# Patient Record
Sex: Female | Born: 1980 | Race: White | Hispanic: No | Smoking: Current every day smoker
Health system: Southern US, Community
[De-identification: ages and names within clinical notes are randomized; demographics above are authoritative.]

## PROBLEM LIST (undated history)

## (undated) DIAGNOSIS — I10 Essential (primary) hypertension: Secondary | ICD-10-CM

## (undated) DIAGNOSIS — R87619 Unspecified abnormal cytological findings in specimens from cervix uteri: Secondary | ICD-10-CM

## (undated) HISTORY — PX: COLPOSCOPY VULVA W/ BIOPSY: SUR282

## (undated) HISTORY — DX: Essential (primary) hypertension: I10

## (undated) HISTORY — DX: Unspecified abnormal cytological findings in specimens from cervix uteri: R87.619

## (undated) HISTORY — PX: DILATION AND CURETTAGE OF UTERUS: SHX78

---
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
## 2002-07-25 ENCOUNTER — Inpatient Hospital Stay (HOSPITAL_COMMUNITY): Admission: AD | Admit: 2002-07-25 | Discharge: 2002-07-27 | Payer: Self-pay | Admitting: Obstetrics and Gynecology

## 2006-01-30 ENCOUNTER — Emergency Department (HOSPITAL_COMMUNITY): Admission: EM | Admit: 2006-01-30 | Discharge: 2006-01-30 | Payer: Self-pay | Admitting: Emergency Medicine

## 2008-11-09 ENCOUNTER — Other Ambulatory Visit: Admission: RE | Admit: 2008-11-09 | Discharge: 2008-11-09 | Payer: Self-pay | Admitting: Obstetrics and Gynecology

## 2009-06-11 ENCOUNTER — Emergency Department (HOSPITAL_COMMUNITY): Admission: EM | Admit: 2009-06-11 | Discharge: 2009-06-11 | Payer: Self-pay | Admitting: Emergency Medicine

## 2009-07-03 ENCOUNTER — Emergency Department (HOSPITAL_COMMUNITY): Admission: EM | Admit: 2009-07-03 | Discharge: 2009-07-03 | Payer: Self-pay | Admitting: Emergency Medicine

## 2009-11-27 ENCOUNTER — Emergency Department (HOSPITAL_COMMUNITY): Admission: EM | Admit: 2009-11-27 | Discharge: 2009-11-27 | Payer: Self-pay | Admitting: Emergency Medicine

## 2014-04-25 ENCOUNTER — Other Ambulatory Visit (HOSPITAL_COMMUNITY)
Admission: RE | Admit: 2014-04-25 | Discharge: 2014-04-25 | Disposition: A | Discharge status: Home / Self Care | Payer: Self-pay | Source: Ambulatory Visit | Attending: Unknown Physician Specialty | Admitting: Unknown Physician Specialty

## 2014-04-25 DIAGNOSIS — D060 Carcinoma in situ of endocervix: Secondary | ICD-10-CM | POA: Insufficient documentation

## 2014-04-25 DIAGNOSIS — Z01419 Encounter for gynecological examination (general) (routine) without abnormal findings: Secondary | ICD-10-CM | POA: Insufficient documentation

## 2014-04-28 LAB — CYTOLOGY - PAP

## 2014-07-24 ENCOUNTER — Encounter: Payer: Self-pay | Admitting: *Deleted

## 2014-07-24 ENCOUNTER — Ambulatory Visit (INDEPENDENT_AMBULATORY_CARE_PROVIDER_SITE_OTHER): Payer: 59 | Admitting: Obstetrics and Gynecology

## 2014-07-24 ENCOUNTER — Encounter: Payer: Self-pay | Admitting: Obstetrics and Gynecology

## 2014-07-24 VITALS — BP 144/88 | Ht 64.0 in

## 2014-07-24 DIAGNOSIS — R87612 Low grade squamous intraepithelial lesion on cytologic smear of cervix (LGSIL): Secondary | ICD-10-CM

## 2014-07-24 DIAGNOSIS — N891 Moderate vaginal dysplasia: Secondary | ICD-10-CM

## 2014-07-24 DIAGNOSIS — R87619 Unspecified abnormal cytological findings in specimens from cervix uteri: Secondary | ICD-10-CM | POA: Insufficient documentation

## 2014-07-24 DIAGNOSIS — Z9889 Other specified postprocedural states: Secondary | ICD-10-CM

## 2014-07-24 NOTE — Progress Notes (Signed)
Patient ID: Kathy Bradley, female   DOB: [birth year], 34 y.o.   MRN: [id number] Pt here today to discuss LEEP procedure. Pt doe snot want to have it done today just wants to discuss it.

## 2014-07-24 NOTE — Progress Notes (Signed)
Patient ID: Kathy Bradley, female   DOB: 17-Oct-1980, 34 y.o.   MRN: [id number] Patient ID: Kathy Bradley, female   DOB: 06/04/1981, 34 y.o.   MRN: [id number]   [facility] Visit  Patient name: Kathy Bradley MRN [id number]  Date of birth: 12-02-1980  CC & HPI:  Kathy Bradley is a 34 y.o. female presenting today to discuss the possibility of a LEEP procedure due to an abnormal pap smear.  She has an IUD in place currently and does not have a menses.  She has had intermittent right sided pelvic pain that she describes as "cramping" that started February 2015.   Pathology at [facility]"CIN II/III/CIS and pap concurs Pt has many questions and is initially skeptical of need for Colpo and probable LEEP, and has questions re" proceeding directly to cesarean. She accepts explanation of need for accuracy of DX.  ROS:  All systems have been reviewed and are negative unless otherwise indicated in the HPI.  Pertinent History Reviewed:   Reviewed: Significant for  Medical         Past Medical History  Diagnosis Date  . Abnormal Pap smear of cervix                               Surgical Hx:    Past Surgical History  Procedure Laterality Date  . Dilation and curettage of uterus    . Colposcopy vulva w/ biopsy     Medications: Reviewed & Updated - see associated section                      Current outpatient prescriptions: levonorgestrel (MIRENA) 20 MCG/24HR IUD, 1 each by Intrauterine route once., Disp: , Rfl:    Social History: Reviewed -  reports that she quit smoking about 8 months ago. Her smoking use included Cigarettes. She has a 10 pack-year smoking history. She has never used smokeless tobacco.  Objective Findings:  Vitals: Blood pressure 144/88, height  (1.626 m).  Physical Examination: Discussion only.  Lengthy discussion of pap smear results and treatment options.   Assessment & Plan:   A:  1. 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 Follow-up  in 2 wks for colpo and probable LEEP  This chart was scribed for Tilda Burrow, MD by Carl Best, ED Scribe. The patient's care was started at 3:15 PM.

## 2014-07-24 NOTE — Progress Notes (Deleted)
Patient ID: Kathy Bradley, female   DOB: [birth year], 34 y.o.   MRN: [id number]   [facility] Ob[facility] Clinic Visit  Patient name: Kathy Bradley MRN [id number]  Date of birth: 09-Aug-1980  CC & HPI:  Kathy Bradley is a 34 y.o. female presenting today to discuss the possibility of a LEEP procedure due to an abnormal pap smear.  She has an IUD in place currently and does not have a menses.  She has had intermittent right sided pelvic pain that she describes as "cramping" that started February 2015.   Pathology at [facility]"CIN II/III/CIS and pap concurs Pt has many questions and is initially skeptical of need for Colpo and probable LEEP, and has questions re" proceeding directly to cesarean. She accepts explanation of need for accuracy of DX.  ROS:  All systems have been reviewed and are negative unless otherwise indicated in the HPI.  Pertinent History Reviewed:   Reviewed: Significant for  Medical         Past Medical History  Diagnosis Date   Abnormal Pap smear of cervix                               Surgical Hx:    Past Surgical History  Procedure Laterality Date   Dilation and curettage of uterus     Colposcopy vulva w/ biopsy     Medications: Reviewed & Updated - see associated section                      Current outpatient prescriptions: levonorgestrel (MIRENA) 20 MCG/24HR IUD, 1 each by Intrauterine route once., Disp: , Rfl:    Social History: Reviewed -  reports that she quit smoking about 8 months ago. Her smoking use included Cigarettes. She has a 10 pack-year smoking history. She has never used smokeless tobacco.  Objective Findings:  Vitals: Blood pressure 144/88, height  (1.626 m).  Physical Examination: Discussion only.  Lengthy discussion of pap smear results and treatment options.   Assessment & Plan:   A:  1. 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 Follow-up in 2 wks for colpo and probable LEEP  This chart was scribed for Tilda Burrow, MD by Carl Best, ED Scribe. The patient's care was started at 3:15 PM.

## 2014-08-07 ENCOUNTER — Encounter: Payer: 59 | Admitting: Obstetrics and Gynecology

## 2014-08-08 ENCOUNTER — Encounter: Payer: Self-pay | Admitting: Obstetrics and Gynecology

## 2014-08-08 ENCOUNTER — Ambulatory Visit (INDEPENDENT_AMBULATORY_CARE_PROVIDER_SITE_OTHER): Payer: 59 | Admitting: Obstetrics and Gynecology

## 2014-08-08 VITALS — BP 156/104 | Ht 64.0 in

## 2014-08-08 DIAGNOSIS — R87613 High grade squamous intraepithelial lesion on cytologic smear of cervix (HGSIL): Secondary | ICD-10-CM

## 2014-08-08 DIAGNOSIS — Z3202 Encounter for pregnancy test, result negative: Secondary | ICD-10-CM

## 2014-08-08 DIAGNOSIS — Z3200 Encounter for pregnancy test, result unknown: Secondary | ICD-10-CM

## 2014-08-08 LAB — POCT URINE PREGNANCY: Preg Test, Ur: NEGATIVE

## 2014-08-08 NOTE — Progress Notes (Signed)
Patient ID: Kathy FreezeJasmine D Bradley, female   DOB: 21-Jun-1981, 34 y.o.   MRN: [id number] Pt here today for Colposcopy and probable LEEP. Pt is very anxious and nervious about the procedures. Procedures were explained and consent was signed for both.   Kathy Bradley 34 y.o. [id number] here for colposcopy for high-grade squamous intraepithelial neoplasia  (HGSIL-encompassing moderate and severe dysplasia) pap smear form health dept.  Discussed role for HPV in cervical dysplasia, need for surveillance.  Patient given informed consent, signed copy in the chart, time out was performed.  Placed in lithotomy position. Cervix viewed with speculum and colposcope after application of acetic acid.   Colposcopy adequate? Yes  acetowhite lesion(s) noted at over anterior lip of everted cervix that has an old obstetric laceration at 9 o'clock ( had a difficult delivery, hosp'd x days, didn't get transfusion)and mosaicism noted at 10-1 olclock o'clock; biopsies obtained at none taken , has prior bx.   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 Will follow up pathology and manage accordingly.  Routine preventative health maintenance measures emphasized.   Will Schedule CKC at hospital with laser

## 2014-08-08 NOTE — Progress Notes (Signed)
Scheduled for 08-22-14 per Dr Emelda FearFerguson request

## 2014-08-10 ENCOUNTER — Telehealth: Payer: Self-pay | Admitting: Obstetrics and Gynecology

## 2014-08-10 NOTE — Telephone Encounter (Signed)
I advised the pt that per Ferg she would be out the rest of the week following her surgery. Pt verbalized understanding.

## 2014-08-15 DIAGNOSIS — Z029 Encounter for administrative examinations, unspecified: Secondary | ICD-10-CM

## 2014-08-15 NOTE — Patient Instructions (Signed)
Kathy Bradley  08/15/2014   Your procedure is scheduled on:  08/22/2014  Report to [facility] at  615  AM.  Call this number if you have problems the morning of surgery: [phone]   Remember:   Do not eat food or drink liquids after midnight.   Take these medicines the morning of surgery with A SIP OF WATER: none   Do not wear jewelry, make-up or nail polish.  Do not wear lotions, powders, or perfumes.   Do not shave 48 hours prior to surgery. Men may shave face and neck.  Do not bring valuables to the hospital.  [facility] is not responsible for any belongings or valuables.               Contacts, dentures or bridgework may not be worn into surgery.  Leave suitcase in the car. After surgery it may be brought to your room.  For patients admitted to the hospital, discharge time is determined by your treatment team.               Patients discharged the day of surgery will not be allowed to drive home.  Name and phone number of your driver: family  Special Instructions: Shower using CHG 2 nights before surgery and the night before surgery.  If you shower the day of surgery use CHG.  Use special wash - you have one bottle of CHG for all showers.  You should use approximately 1/3 of the bottle for each shower.   Please read over the following fact sheets that you were given: Pain Booklet, Coughing and Deep Breathing, Surgical Site Infection Prevention, Anesthesia Post-op Instructions and Care and Recovery After Surgery Conization of the Cervix Cervical conization is the cutting (excision) of a cone-shaped portion of the cervix. The procedure is performed through the vagina in either your health care provider's office or an operating room. This procedure is usually done when there is abnormal bleeding from the cervix. It can also be done to evaluate an abnormal Pap test or if an abnormality is seen on the cervix during an exam. The tissue is then examined to see if there are  precancerous cells or cancer present.  Conization of the cervix is not done during a menstrual period or pregnancy.  LET [facility] CARE PROVIDER KNOW ABOUT:  Any allergies you have.   All medicines you are taking, including vitamins, herbs, eye drops, creams, and over-the-counter medicines.   Previous problems you or members of your family have had with the use of anesthetics.   Any blood disorders you have.   Previous surgeries you have had.   Medical conditions you have.   Your smoking habits.   The possibility of being pregnant.  RISKS AND COMPLICATIONS  Generally, conization of the cervix is a safe procedure. However, as with any procedure, complications can occur. Possible complications include:  Heavy bleeding several days or weeks after the procedure. Light bleeding or spotting after the procedure is normal.  Infection (rare).  Damage to the cervix or surrounding organs (uncommon).   Problems with the anesthesia.   Increased risk of preterm labor in future pregnancies. BEFORE THE PROCEDURE  Do not eat or drink anything for 6-8 hours before the procedure.   Do not take aspirin or blood thinners for at least a week before the procedure or as directed by your health care provider.   Arrange for someone to take you home after the procedure.  PROCEDURE There are three different methods to perform conization of the cervix. These include:   The cold knife method. In this method a small cone-shaped sample of tissue is cut out with a knife (scalpel) from the cervical canal and the transformation zone (where the normal cells end and the abnormal cells begin).   The LEEP method. In this method a small cone-shaped sample of tissue is cut out with a thin wire that can burn (cauterize) the cervical tissue with an electrical current.   Laser treatment. In this method a small cone-shaped sample of tissue is cut out and then cauterized with a laser beam to prevent  bleeding.  The procedure will be performed as follows:   Depending on the method, you will either be given a medicine to make you sleep (general anesthetic) or a numbing medicine (local anesthetic). A medicine that numbs the cervix (cervical block) may be given.   A lubricated device called a speculum will be inserted into the vagina to spread open the walls of the vagina. This will help your health care provider see the inside of the vagina and cervix better.   The tissue from the cervix will be removed and examined.   The results of the procedure will help your health care provider decide if further treatment is necessary. They will also help your health care provider decide on the best treatment if your results are abnormal. AFTER THE PROCEDURE  If you had a general anesthetic, you may be groggy for 2-3 hours after the procedure.   If you had a local anesthetic, you will rest at the clinic or hospital until you are stable and feel ready to go home.   Recovery may take up to 3 weeks.   You may have some cramping for about 1 week.   You may have bloody discharge or light bleeding for 1-2 weeks.   You may have black discharge coming from the vagina. This is from the paste used on the cervix to prevent bleeding. This is normal discharge.  Document Released: 04/16/2005 Document Revised: 07/12/2013 Document Reviewed: 12/31/2012 [facility] Patient Information 2015 [employer] [place], [place]. This information is not intended to replace advice given to you by your health care provider. Make sure you discuss any questions you have with your health care provider. PATIENT INSTRUCTIONS POST-ANESTHESIA  IMMEDIATELY FOLLOWING SURGERY:  Do not drive or operate machinery for the first twenty four hours after surgery.  Do not make any important decisions for twenty four hours after surgery or while taking narcotic pain medications or sedatives.  If you develop intractable nausea and vomiting or a severe  headache please notify your doctor immediately.  FOLLOW-UP:  Please make an appointment with your surgeon as instructed. You do not need to follow up with anesthesia unless specifically instructed to do so.  WOUND CARE INSTRUCTIONS (if applicable):  Keep a dry clean dressing on the anesthesia/puncture wound site if there is drainage.  Once the wound has quit draining you may leave it open to air.  Generally you should leave the bandage intact for twenty four hours unless there is drainage.  If the epidural site drains for more than 36-48 hours please call the anesthesia department.  QUESTIONS?:  Please feel free to call your physician or the hospital operator if you have any questions, and they will be happy to assist you.

## 2014-08-16 ENCOUNTER — Other Ambulatory Visit: Payer: Self-pay | Admitting: Obstetrics and Gynecology

## 2014-08-16 ENCOUNTER — Encounter (HOSPITAL_COMMUNITY): Payer: Self-pay

## 2014-08-16 ENCOUNTER — Encounter (HOSPITAL_COMMUNITY)
Admission: RE | Admit: 2014-08-16 | Discharge: 2014-08-16 | Disposition: A | Discharge status: Home / Self Care | Payer: 59 | Source: Ambulatory Visit | Attending: Obstetrics and Gynecology | Admitting: Obstetrics and Gynecology

## 2014-08-16 DIAGNOSIS — Z01812 Encounter for preprocedural laboratory examination: Secondary | ICD-10-CM | POA: Diagnosis not present

## 2014-08-16 DIAGNOSIS — N871 Moderate cervical dysplasia: Secondary | ICD-10-CM | POA: Insufficient documentation

## 2014-08-16 DIAGNOSIS — Z87891 Personal history of nicotine dependence: Secondary | ICD-10-CM | POA: Diagnosis not present

## 2014-08-16 LAB — CBC
HCT: 38.7 % (ref 36.0–46.0)
Hemoglobin: 12.9 g/dL (ref 12.0–15.0)
MCH: 30.4 pg (ref 26.0–34.0)
MCHC: 33.3 g/dL (ref 30.0–36.0)
MCV: 91.3 fL (ref 78.0–100.0)
Platelets: 167 10*3/uL (ref 150–400)
RBC: 4.24 MIL/uL (ref 3.87–5.11)
RDW: 13.1 % (ref 11.5–15.5)
WBC: 6 10*3/uL (ref 4.0–10.5)

## 2014-08-16 LAB — URINALYSIS, ROUTINE W REFLEX MICROSCOPIC
Bilirubin Urine: NEGATIVE
Glucose, UA: NEGATIVE mg/dL
Ketones, ur: NEGATIVE mg/dL
Nitrite: NEGATIVE
Protein, ur: NEGATIVE mg/dL
Specific Gravity, Urine: >1.03 — ABNORMAL HIGH (ref 1.005–1.030)
Urobilinogen, UA: 0.2 mg/dL (ref 0.0–1.0)
pH: 6 (ref 5.0–8.0)

## 2014-08-16 LAB — URINE MICROSCOPIC-ADD ON

## 2014-08-16 LAB — HCG, SERUM, QUALITATIVE: Preg, Serum: NEGATIVE

## 2014-08-18 ENCOUNTER — Telehealth: Payer: Self-pay | Admitting: Obstetrics and Gynecology

## 2014-08-18 NOTE — Telephone Encounter (Signed)
Will make necessary changes on FMLA and refax to pt.

## 2014-08-21 ENCOUNTER — Other Ambulatory Visit: Payer: Self-pay | Admitting: Obstetrics and Gynecology

## 2014-08-21 NOTE — H&P (Signed)
Kathy Bradley is an 34 y.o. female. She is admitted for Cervical cold knife conization, with plans to use the laser due to the significant irregularity to the cervical contour from a prior obstetric laceration to the cervix. 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 The patient is unwilling to consider leep, under local anesthesia due to patient anxiety.  Pertinent Gynecological History: Menses: flow is moderate Bleeding:  Contraception: IUD DES exposure: unknown Blood transfusions: none Sexually transmitted diseases: no past history Previous GYN Procedures[facility]  Last mammogram:  Date:  Last pap: abnormal: 2015 Date: Cin II-II OB History: G2, P1011   Menstrual History: Menarche age:  No LMP recorded. Patient is not currently having periods (Reason: IUD).    Past Medical History  Diagnosis Date  . Abnormal Pap smear of cervix     Past Surgical History  Procedure Laterality Date  . Dilation and curettage of uterus    . Colposcopy vulva w/ biopsy      Family History  Problem Relation Age of Onset  . Hypertension Mother   . Hypertension Father   . Cancer Paternal Aunt     cervical  . Hypertension Maternal Grandmother   . Hypertension Maternal Grandfather   . Cancer Paternal Grandmother     ovarian    Social History:  reports that she quit smoking about 9 months ago. Her smoking use included Cigarettes. She has a 10 pack-year smoking history. She has never used smokeless tobacco. She reports that she drinks alcohol. She reports that she does not use illicit drugs.  Allergies:  Allergies  Allergen Reactions  . Penicillins      (Not in a hospital admission)  Review of Systems  Eyes: Negative.   Cardiovascular: Negative.   Genitourinary: Negative for dysuria, urgency and frequency.   Musculoskeletal: Negative for myalgias.  Neurological: Negative for dizziness.    There were no vitals taken for this visit. Physical Exam  Constitutional: She is oriented to person, place, and time. She appears well-developed and well-nourished.  HENT:  Head: Normocephalic and atraumatic.  Eyes: Pupils are equal, round, and reactive to light.  Cardiovascular: Normal rate.   Respiratory: Effort normal.  GI: Soft.  Genitourinary: Vagina normal and uterus normal.  Cervix lesion across entire anterior lip with irregular cervix with old ob laceration of cervical tissue for this porcedure.  Musculoskeletal: Normal range of motion. She exhibits no edema.  Neurological: She is alert and oriented to person, place, and time. She has normal reflexes.  Skin: Skin is warm and dry.  Psychiatric: She has a normal mood and affect. Her behavior is normal.    No results found for this or any previous visit (from the past 24 hour(s)).  No results found.  Assessment/Plan: CIN II-III, for laser removal this wee,.  Kathy Bradley V 08/21/2014, 8:54 PM

## 2014-08-22 ENCOUNTER — Encounter (HOSPITAL_COMMUNITY)
Admission: RE | Disposition: A | Discharge status: Home / Self Care | Payer: Self-pay | Source: Ambulatory Visit | Attending: Obstetrics and Gynecology

## 2014-08-22 ENCOUNTER — Ambulatory Visit (HOSPITAL_COMMUNITY): Payer: 59 | Admitting: Anesthesiology

## 2014-08-22 ENCOUNTER — Ambulatory Visit (HOSPITAL_COMMUNITY)
Admission: RE | Admit: 2014-08-22 | Discharge: 2014-08-22 | Disposition: A | Discharge status: Home / Self Care | Payer: 59 | Source: Ambulatory Visit | Attending: Obstetrics and Gynecology | Admitting: Obstetrics and Gynecology

## 2014-08-22 DIAGNOSIS — Z880 Allergy status to penicillin: Secondary | ICD-10-CM | POA: Diagnosis not present

## 2014-08-22 DIAGNOSIS — N871 Moderate cervical dysplasia: Secondary | ICD-10-CM

## 2014-08-22 DIAGNOSIS — N879 Dysplasia of cervix uteri, unspecified: Secondary | ICD-10-CM | POA: Diagnosis present

## 2014-08-22 DIAGNOSIS — Z87891 Personal history of nicotine dependence: Secondary | ICD-10-CM | POA: Insufficient documentation

## 2014-08-22 DIAGNOSIS — N870 Mild cervical dysplasia: Secondary | ICD-10-CM

## 2014-08-22 DIAGNOSIS — D069 Carcinoma in situ of cervix, unspecified: Secondary | ICD-10-CM

## 2014-08-22 HISTORY — PX: HOLMIUM LASER APPLICATION: SHX5852

## 2014-08-22 HISTORY — PX: CERVICAL CONIZATION W/BX: SHX1330

## 2014-08-22 SURGERY — CONE BIOPSY, CERVIX
Anesthesia: General

## 2014-08-22 MED ORDER — ONDANSETRON HCL 4 MG/2ML IJ SOLN
4.0000 mg | Freq: Once | INTRAMUSCULAR | Status: AC
  Administered 2014-08-22: 4 mg via INTRAVENOUS

## 2014-08-22 MED ORDER — LACTATED RINGERS IV SOLN
INTRAVENOUS | Status: DC
  Administered 2014-08-22: 1000 mL via INTRAVENOUS

## 2014-08-22 MED ORDER — FERRIC SUBSULFATE 259 MG/GM EX SOLN
CUTANEOUS | Status: DC | PRN
  Administered 2014-08-22: 1 via TOPICAL

## 2014-08-22 MED ORDER — FENTANYL CITRATE 0.05 MG/ML IJ SOLN
25.0000 ug | INTRAMUSCULAR | Status: AC
  Administered 2014-08-22 (×2): 25 ug via INTRAVENOUS

## 2014-08-22 MED ORDER — FERRIC SUBSULFATE 259 MG/GM EX SOLN
CUTANEOUS | Status: AC
  Filled 2014-08-22: qty 8

## 2014-08-22 MED ORDER — MIDAZOLAM HCL 5 MG/5ML IJ SOLN
INTRAMUSCULAR | Status: DC | PRN
  Administered 2014-08-22: 2 mg via INTRAVENOUS

## 2014-08-22 MED ORDER — FENTANYL CITRATE 0.05 MG/ML IJ SOLN
INTRAMUSCULAR | Status: DC | PRN
  Administered 2014-08-22 (×2): 25 ug via INTRAVENOUS
  Administered 2014-08-22: 50 ug via INTRAVENOUS

## 2014-08-22 MED ORDER — BUPIVACAINE-EPINEPHRINE 0.5% -1:200000 IJ SOLN
INTRAMUSCULAR | Status: DC | PRN
  Administered 2014-08-22: 20 mL

## 2014-08-22 MED ORDER — BUPIVACAINE-EPINEPHRINE (PF) 0.5% -1:200000 IJ SOLN
INTRAMUSCULAR | Status: AC
  Filled 2014-08-22: qty 30

## 2014-08-22 MED ORDER — GLYCOPYRROLATE 0.2 MG/ML IJ SOLN
INTRAMUSCULAR | Status: AC
  Filled 2014-08-22: qty 1

## 2014-08-22 MED ORDER — KETOROLAC TROMETHAMINE 10 MG PO TABS: 10.0000 mg | ORAL_TABLET | Freq: Four times a day (QID) | ORAL | Status: DC | PRN

## 2014-08-22 MED ORDER — MIDAZOLAM HCL 2 MG/2ML IJ SOLN
INTRAMUSCULAR | Status: AC
  Filled 2014-08-22: qty 2

## 2014-08-22 MED ORDER — LIDOCAINE HCL (PF) 1 % IJ SOLN
INTRAMUSCULAR | Status: AC
  Filled 2014-08-22: qty 5

## 2014-08-22 MED ORDER — METRONIDAZOLE 0.75 % VA GEL: 1.0000 | Freq: Every day | VAGINAL | Status: DC

## 2014-08-22 MED ORDER — MIDAZOLAM HCL 2 MG/2ML IJ SOLN
1.0000 mg | INTRAMUSCULAR | Status: DC | PRN
  Administered 2014-08-22: 2 mg via INTRAVENOUS

## 2014-08-22 MED ORDER — GLYCOPYRROLATE 0.2 MG/ML IJ SOLN
0.2000 mg | Freq: Once | INTRAMUSCULAR | Status: AC
  Administered 2014-08-22: 0.2 mg via INTRAVENOUS

## 2014-08-22 MED ORDER — OXYCODONE-ACETAMINOPHEN 5-325 MG PO TABS: 1.0000 | ORAL_TABLET | ORAL | Status: DC | PRN

## 2014-08-22 MED ORDER — PROPOFOL 10 MG/ML IV BOLUS
INTRAVENOUS | Status: DC | PRN
  Administered 2014-08-22: 150 mg via INTRAVENOUS

## 2014-08-22 MED ORDER — FENTANYL CITRATE 0.05 MG/ML IJ SOLN
INTRAMUSCULAR | Status: AC
  Filled 2014-08-22: qty 2

## 2014-08-22 MED ORDER — LIDOCAINE HCL 1 % IJ SOLN
INTRAMUSCULAR | Status: DC | PRN
  Administered 2014-08-22: 30 mg via INTRADERMAL

## 2014-08-22 MED ORDER — PROPOFOL 10 MG/ML IV BOLUS
INTRAVENOUS | Status: AC
  Filled 2014-08-22: qty 20

## 2014-08-22 MED ORDER — ONDANSETRON HCL 4 MG/2ML IJ SOLN
INTRAMUSCULAR | Status: AC
  Filled 2014-08-22: qty 2

## 2014-08-22 SURGICAL SUPPLY — 28 items
BAG HAMPER (MISCELLANEOUS) ×2 IMPLANT
BLADE SURG 15 STRL LF DISP TIS (BLADE) ×1 IMPLANT
BLADE SURG 15 STRL SS (BLADE)
CATH ROBINSON RED A/P 16FR (CATHETERS) IMPLANT
CLOTH BEACON ORANGE TIMEOUT ST (SAFETY) ×2 IMPLANT
COVER LIGHT HANDLE STERIS (MISCELLANEOUS) ×4 IMPLANT
DRAPE PROXIMA HALF (DRAPES) ×2 IMPLANT
ELECT REM PT RETURN 9FT ADLT (ELECTROSURGICAL) ×2
ELECTRODE REM PT RTRN 9FT ADLT (ELECTROSURGICAL) ×1 IMPLANT
FORMALIN 10 PREFIL 120ML (MISCELLANEOUS) ×2 IMPLANT
GLOVE BIO SURGEON STRL SZ 6.5 (GLOVE) ×1 IMPLANT
GLOVE BIOGEL PI IND STRL 7.0 (GLOVE) IMPLANT
GLOVE BIOGEL PI IND STRL 9 (GLOVE) ×1 IMPLANT
GLOVE BIOGEL PI INDICATOR 7.0 (GLOVE) ×3
GLOVE BIOGEL PI INDICATOR 9 (GLOVE) ×1
GLOVE ECLIPSE 6.5 STRL STRAW (GLOVE) ×1 IMPLANT
GLOVE ECLIPSE 9.0 STRL (GLOVE) ×2 IMPLANT
GOWN SPEC L3 XXLG W/TWL (GOWN DISPOSABLE) ×2 IMPLANT
GOWN STRL REUS W/TWL LRG LVL3 (GOWN DISPOSABLE) ×3 IMPLANT
KIT ROOM TURNOVER AP CYSTO (KITS) ×2 IMPLANT
MANIFOLD NEPTUNE II (INSTRUMENTS) ×2 IMPLANT
NDL SPNL 22GX3.5 QUINCKE BK (NEEDLE) IMPLANT
NEEDLE SPNL 22GX3.5 QUINCKE BK (NEEDLE) IMPLANT
PACK PERI GYN (CUSTOM PROCEDURE TRAY) ×2 IMPLANT
PAD ARMBOARD 7.5X6 YLW CONV (MISCELLANEOUS) ×2 IMPLANT
SET BASIN LINEN APH (SET/KITS/TRAYS/PACK) ×2 IMPLANT
SUT CHROMIC 2 0 CT 1 (SUTURE) ×2 IMPLANT
SYRINGE 10CC LL (SYRINGE) ×2 IMPLANT

## 2014-08-22 NOTE — Brief Op Note (Signed)
08/22/2014  9:25 AM  PATIENT:  Kathy Bradley  34 y.o. female  PRE-OPERATIVE DIAGNOSIS:  High Grade Cervical Dysplasia  POST-OPERATIVE DIAGNOSIS:  High Grade Cervical Dysplasia  PROCEDURE:  Procedure(s): CONIZATION CERVIX WITH BIOPSY (N/A) HOLMIUM LASER APPLICATION (N/A)  SURGEON:  Surgeon(s) and Role:    * Tilda BurrowJohn Chrsitopher Wik V, MD - Primary  PHYSICIAN ASSISTANT:   ASSISTANTS: none   ANESTHESIA:   local and general  EBL:  Total I/O In: 750 [I.V.:750] Out: -   BLOOD ADMINISTERED:none  DRAINS: none   LOCAL MEDICATIONS USED:  MARCAINE     SPECIMEN:  Source of Specimen:  Cervical conization specimen  DISPOSITION OF SPECIMEN:  PATHOLOGY  COUNTS:  YES  TOURNIQUET:  * No tourniquets in log *  DICTATION: .Dragon Dictation  PLAN OF CARE: Discharge to home after PACU  PATIENT DISPOSITION:  PACU - hemodynamically stable.   Delay start of Pharmacological VTE agent (>24hrs) due to surgical blood loss or risk of bleeding: not applicable

## 2014-08-22 NOTE — Anesthesia Postprocedure Evaluation (Signed)
  Anesthesia Post-op Note  Patient: Kathy Bradley  Procedure(s) Performed: Procedure(s): CONIZATION CERVIX WITH BIOPSY (N/A) HOLMIUM LASER APPLICATION (N/A)  Patient Location: PACU  Anesthesia Type:General  Level of Consciousness: awake, alert , oriented and patient cooperative  Airway and Oxygen Therapy: Patient Spontanous Breathing  Post-op Pain: none  Post-op Assessment: Post-op Vital signs reviewed, Patient's Cardiovascular Status Stable, Respiratory Function Stable, Patent Airway, No signs of Nausea or vomiting and Pain level controlled  Post-op Vital Signs: Reviewed and stable  Last Vitals:  Filed Vitals:   08/22/14 0730  BP: 151/102  Temp:   Resp: 15    Complications: No apparent anesthesia complications

## 2014-08-22 NOTE — Anesthesia Preprocedure Evaluation (Addendum)
Anesthesia Evaluation  Patient identified by MRN, date of birth, ID band Patient awake    Reviewed: Allergy & Precautions, NPO status , Patient's Chart, lab work & pertinent test results  Airway Mallampati: II  TM Distance: >3 FB     Dental  (+) Poor Dentition, Chipped, Missing, Dental Advisory Given   Pulmonary former smoker,  breath sounds clear to auscultation        Cardiovascular negative cardio ROS  Rhythm:Regular Rate:Normal     Neuro/Psych    GI/Hepatic negative GI ROS,   Endo/Other    Renal/GU      Musculoskeletal   Abdominal   Peds  Hematology   Anesthesia Other Findings   Reproductive/Obstetrics                            Anesthesia Physical Anesthesia Plan  ASA: I  Anesthesia Plan: General   Post-op Pain Management:    Induction: Intravenous  Airway Management Planned: LMA  Additional Equipment:   Intra-op Plan:   Post-operative Plan: Extubation in OR  Informed Consent: I have reviewed the patients History and Physical, chart, labs and discussed the procedure including the risks, benefits and alternatives for the proposed anesthesia with the patient or authorized representative who has indicated his/her understanding and acceptance.     Plan Discussed with:   Anesthesia Plan Comments:         Anesthesia Quick Evaluation

## 2014-08-22 NOTE — Discharge Instructions (Signed)
Conization of the Cervix, Care After °Refer to this sheet in the next few weeks. These instructions provide you with information on caring for yourself after your procedure. Your health care provider may also give you more specific instructions. Your treatment has been planned according to current medical practices but problems sometimes occur. Call your health care provider if you have any problems or questions after your procedure. °WHAT TO EXPECT AFTER THE PROCEDURE °After your procedure, it is typical to have the following sensations: °· If you had a general anesthetic, you may be groggy for 2-3 hours after the procedure. °· You may have cramps (similar to menstrual cramps) for about 1 week.   °· You may have a bloody discharge or light to moderate bleeding for 1-2 weeks.  The bleeding should not be heavy (for example, it should not soak 1 pad in less than 1 hour). °· You may have a black vaginal discharge that looks similar to coffee grounds. This is from the paste that was applied to the cervix to control bleeding. This is normal. °Recovery may take up to 3 weeks.  °HOME CARE INSTRUCTIONS  °· Arrange for someone to drive you home after the procedure. °· Only take medicines as directed by your health care provider. Do not take aspirin. It can cause bleeding.   °· Take showers for the first week. Do not take baths, swim, or use hot tubs until your health care provider says it is okay.   °· Do not douche, use tampons, or have sexual intercourse until your health care provider says it is okay.   °· Avoid strenuous activities, exercises, and heavy lifting for at least 7-14 days. °· You may resume your normal diet unless your health care provider advises you differently.     °· If you are constipated, you may: °¨ Take a mild laxative as directed by your health care provider.   °¨ Add fruit and bran to your diet.   °¨ Make sure to drink enough fluids to keep your urine clear or pale yellow. °· Keep follow-up  appointments with your health care provider. °SEEK MEDICAL CARE IF:  °· You develop a rash.   °· You are dizzy or lightheaded.   °· You feel nauseous.   °· You develop a bad smelling vaginal discharge. °SEEK IMMEDIATE MEDICAL CARE IF:  °· You have blood clots or bleeding that is heavier than a normal menstrual period (for example, soaking a pad in less than 1 hour) or you develop bright red bleeding.   °· You have a fever over 101°F (38.3°C) or persistent symptoms for more than 2-3 days.   °· You have a fever over 101°F (38.3°C) and your symptoms suddenly get worse. °· You have increasing cramps.   °· You faint.   °· You have pain when urinating. °· You have bloody urine.   °· You start vomiting.   °· Your pain is not relieved with your medicine.   °· Your have severe or worsening pain. °MAKE SURE YOU: °· Understand these instructions. °· Will watch your condition. °· Will get help right away if you are not doing well or get worse. °Document Released: 07/07/2005 Document Revised: 07/12/2013 Document Reviewed: 12/31/2012 °ExitCare® Patient Information ©2015 [employer], LLC. This information is not intended to replace advice given to you by your health care provider. Make sure you discuss any questions you have with your health care provider. ° °

## 2014-08-22 NOTE — Anesthesia Procedure Notes (Signed)
Procedure Name: LMA Insertion Date/Time: 08/22/2014 7:44 AM Performed by: Despina HiddenIDACAVAGE, Marletta Bousquet J Pre-anesthesia Checklist: Emergency Drugs available, Patient identified, Suction available and Patient being monitored Patient Re-evaluated:Patient Re-evaluated prior to inductionOxygen Delivery Method: Circle system utilized Preoxygenation: Pre-oxygenation with 100% oxygen Intubation Type: IV induction Ventilation: Mask ventilation without difficulty LMA: LMA inserted LMA Size: 4.0 Grade View: Grade I Tube type: Oral Number of attempts: 1 Placement Confirmation: breath sounds checked- equal and bilateral and positive ETCO2 Tube secured with: Tape Dental Injury: Teeth and Oropharynx as per pre-operative assessment  Comments: Extremely poor dental condition per pre assessment

## 2014-08-22 NOTE — Transfer of Care (Signed)
Immediate Anesthesia Transfer of Care Note  Patient: Kathy FreezeJasmine D Bradley  Procedure(s) Performed: Procedure(s): CONIZATION CERVIX WITH BIOPSY (N/A) HOLMIUM LASER APPLICATION (N/A)  Patient Location: PACU  Anesthesia Type:General  Level of Consciousness: awake, oriented and patient cooperative  Airway & Oxygen Therapy: Patient Spontanous Breathing  Post-op Assessment: Report given to RN, Post -op Vital signs reviewed and stable and Patient moving all extremities  Post vital signs: Reviewed and stable  Last Vitals:  Filed Vitals:   08/22/14 0730  BP: 151/102  Temp:   Resp: 15    Complications: No apparent anesthesia complications

## 2014-08-22 NOTE — Op Note (Signed)
08/22/2014  9:25 AM  PATIENT:  Kathy Bradley  34 y.o. female  PRE-OPERATIVE DIAGNOSIS:  High Grade Cervical Dysplasia  POST-OPERATIVE DIAGNOSIS:  High Grade Cervical Dysplasia  PROCEDURE:  Procedure(s): CONIZATION CERVIX WITH BIOPSY (N/A) HOLMIUM LASER APPLICATION (N/A)  SURGEON:  Surgeon(s) and Role:    * Tilda BurrowJohn Allyn Bartelson V, MD - Primary  PHYSICIAN ASSISTANT:   ASSISTANTS: none   ANESTHESIA:   local and general  EBL:  Total I/O In: 750 [I.V.:750] Out: -   BLOOD ADMINISTERED:none  DRAINS: none   LOCAL MEDICATIONS USED:  MARCAINE     SPECIMEN:  Source of Specimen:  Cervical conization specimen  DISPOSITION OF SPECIMEN:  PATHOLOGY  COUNTS:  YES  TOURNIQUET:  * No tourniquets in log *  DICTATION: .Dragon Dictation  PLAN OF CARE: Discharge to home after PACU  PATIENT DISPOSITION:  PACU - hemodynamically stable.   Delay start of Pharmacological VTE agent (>24hrs) due to surgical blood loss or risk of bleeding: not applicable Details of procedure: Patient was taken operating room prepped and draped for vaginal procedure, with Betadine used. Timeout was conducted and surgical procedure confirmed by operative team Ancef was administered. Acetic acid was applied to cervix which identified the margins of the abnormal appearing tissue. Using the laser, holmium laser a circumferential laser vaporization of the external margin, approximately 5 mm from the edge of the abnormal tissue was performed. There was some lateral spread so the lateral margins of the surgical specimen may appear to be uninterpretable, but based on the visualization through the laser camera I believe that the margins should be clear of actual disease.. Follow-up monitoring can be performed a colposcopy if needed. The central bed of the specimen was removed using knife dissection. The specimen was interrupted at 3:00 during traction and countertraction. Once the specimen was completely removed, 1/2 cm wide by 1.5  cm the specimen was removed, it was sutured at 12:00 for orientation. The bed was relatively hemostatic and responded to Monsel solution. Prior to the procedure a paracervical block with mirror Marcaine with epinephrine had been injected. Patient sent to recovery in stable condition

## 2014-08-22 NOTE — Anesthesia Postprocedure Evaluation (Signed)
  Anesthesia Post-op Note  Patient: Kathy FreezeJasmine D Ham  Procedure(s) Performed: Procedure(s): CONIZATION CERVIX WITH BIOPSY (N/A) HOLMIUM LASER APPLICATION (N/A)  Patient Location: PACU  Anesthesia Type:General  Level of Consciousness: awake, alert  and patient cooperative  Airway and Oxygen Therapy: Patient Spontanous Breathing and Patient connected to face mask oxygen  Post-op Pain: none  Post-op Assessment: Post-op Vital signs reviewed, Patient's Cardiovascular Status Stable, Respiratory Function Stable, Patent Airway, No signs of Nausea or vomiting and Pain level controlled  Post-op Vital Signs: Reviewed and stable  Last Vitals:  Filed Vitals:   08/22/14 0730  BP: 151/102  Temp:   Resp: 15    Complications: No apparent anesthesia complications

## 2014-08-22 NOTE — Interval H&P Note (Signed)
History and Physical Interval Note:  08/22/2014 7:33 AM  Kathy FreezeJasmine D Bradley  has presented today for surgery, with the diagnosis of High Grade Cervical Dysplasia  The various methods of treatment have been discussed with the patient and family. After consideration of risks, benefits and other options for treatment, the patient has consented to  Procedure(s): CONIZATION CERVIX WITH BIOPSY (N/A) HOLMIUM LASER APPLICATION (N/A) as a surgical intervention .  The patient's history has been reviewed, patient examined, no change in status, stable for surgery.  I have reviewed the patient's chart and labs.  Questions were answered to the patient's satisfaction.     Tilda BurrowFERGUSON,Chrystine Frogge V

## 2014-08-22 NOTE — H&P (View-Only) (Signed)
Kathy Bradley is an 33 y.o. female. She is admitted for Cervical cold knife conization, with plans to use the laser due to the significant irregularity to the cervical contour from a prior obstetric laceration to the cervix. 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 The patient is unwilling to consider leep, under local anesthesia due to patient anxiety.  Pertinent Gynecological History: Menses: flow is moderate Bleeding:  Contraception: IUD DES exposure: unknown Blood transfusions: none Sexually transmitted diseases: no past history Previous GYN Procedures: cervix biopsies at [facility],and confirmed at [facility].  Last mammogram:  Date:  Last pap: abnormal: 2015 Date: Cin II-II OB History: G2, P1011   Menstrual History: Menarche age:  No LMP recorded. Patient is not currently having periods (Reason: IUD).    Past Medical History  Diagnosis Date  . Abnormal Pap smear of cervix     Past Surgical History  Procedure Laterality Date  . Dilation and curettage of uterus    . Colposcopy vulva w/ biopsy      Family History  Problem Relation Age of Onset  . Hypertension Mother   . Hypertension Father   . Cancer Paternal Aunt     cervical  . Hypertension Maternal Grandmother   . Hypertension Maternal Grandfather   . Cancer Paternal Grandmother     ovarian    Social History:  reports that she quit smoking about 9 months ago. Her smoking use included Cigarettes. She has a 10 pack-year smoking history. She has never used smokeless tobacco. She reports that she drinks alcohol. She reports that she does not use illicit drugs.  Allergies:  Allergies  Allergen Reactions  . Penicillins      (Not in a hospital admission)  Review of Systems  Eyes: Negative.   Cardiovascular: Negative.   Genitourinary: Negative for dysuria, urgency and frequency.   Musculoskeletal: Negative for myalgias.  Neurological: Negative for dizziness.    There were no vitals taken for this visit. Physical Exam  Constitutional: She is oriented to person, place, and time. She appears well-developed and well-nourished.  HENT:  Head: Normocephalic and atraumatic.  Eyes: Pupils are equal, round, and reactive to light.  Cardiovascular: Normal rate.   Respiratory: Effort normal.  GI: Soft.  Genitourinary: Vagina normal and uterus normal.  Cervix lesion across entire anterior lip with irregular cervix with old ob laceration of cervical tissue for this porcedure.  Musculoskeletal: Normal range of motion. She exhibits no edema.  Neurological: She is alert and oriented to person, place, and time. She has normal reflexes.  Skin: Skin is warm and dry.  Psychiatric: She has a normal mood and affect. Her behavior is normal.    No results found for this or any previous visit (from the past 24 hour(s)).  No results found.  Assessment/Plan: CIN II-III, for laser removal this wee,.  Kathy Bradley V 08/21/2014, 8:54 PM  

## 2014-08-23 ENCOUNTER — Encounter (HOSPITAL_COMMUNITY): Payer: Self-pay | Admitting: Obstetrics and Gynecology

## 2014-08-25 ENCOUNTER — Telehealth: Payer: Self-pay | Admitting: Obstetrics and Gynecology

## 2014-08-25 NOTE — Telephone Encounter (Signed)
Note faxed to number given by pt!

## 2014-08-29 ENCOUNTER — Encounter: Payer: 59 | Admitting: Obstetrics and Gynecology

## 2014-08-30 ENCOUNTER — Telehealth: Payer: Self-pay | Admitting: *Deleted

## 2014-08-30 NOTE — Telephone Encounter (Signed)
Pt informed of Dr. Emelda FearFerguson recommendation from Conization procedure from 08/22/2013. Pt has a post op appt with Dr. Emelda FearFerguson on 09/06/2014 and states will make the 3 mos appt for colposcopy at that time.

## 2014-08-30 NOTE — Telephone Encounter (Signed)
-----   Message from Kathy BurrowJohn Ferguson V, MD sent at 08/29/2014  5:23 PM EST ----- The pathology report shows Both High grade and Low Grade abnormalities extending " to the Cauterized outer edge" which is where the laser was used . This was done under direct colposcope visualization, and the laser vaporized an area that extended BEYOND the edges of the abnormal areas, which could be easily seen on the colposcope. I feel the outer edges were clear of high grade abnormalities, but close monitoring is still advised.  I would recommend looking at the cervix with the colposcopy at 3 months after this procedure

## 2014-09-06 ENCOUNTER — Encounter: Payer: Self-pay | Admitting: Obstetrics and Gynecology

## 2014-09-06 ENCOUNTER — Encounter: Payer: 59 | Admitting: Obstetrics and Gynecology

## 2014-09-06 ENCOUNTER — Ambulatory Visit (INDEPENDENT_AMBULATORY_CARE_PROVIDER_SITE_OTHER): Payer: 59 | Admitting: Obstetrics and Gynecology

## 2014-09-06 VITALS — BP 150/98 | Ht 64.0 in | Wt 168.0 lb

## 2014-09-06 DIAGNOSIS — N72 Inflammatory disease of cervix uteri: Secondary | ICD-10-CM

## 2014-09-06 DIAGNOSIS — Z09 Encounter for follow-up examination after completed treatment for conditions other than malignant neoplasm: Secondary | ICD-10-CM

## 2014-09-06 MED ORDER — METRONIDAZOLE 0.75 % VA GEL: 1.0000 | Freq: Every day | VAGINAL | Status: DC

## 2014-09-06 NOTE — Progress Notes (Signed)
Patient ID: Kathy FreezeJasmine D Bradley, female   DOB: Jan 20, 1981, 34 y.o.   MRN: [id number] Pt here today for post op visit. Pt denies any problems or concerns at this time.   Subjective:  Kathy Bradley is a 34 y.o. female who presents to the clinic 2 weeks status post ckc, laser used.   patH: cin III with external margins + for cin III but the laser was used and margin somewhat obliterated , see my note. Review of Systems Negative except heavy d/c  She has been eating a regular diet without difficulty.less bloating than befor e surgery   Bowel movements are normal. The patient is not having any pain.  Objective:  BP 150/98 mmHg  Ht 5\' 4"  (1.626 m)  Wt 168 lb (76.204 kg)  BMI 28.82 kg/m2 General:Well developed, well nourished.  No acute distress. Abdomen: Bowel sounds normal, soft, non-tender. Pelvic Exam:    External Genitalia:  Normal.    Vagina: Normal    Bimanual: Normal    Cervix: leukorrhea, healing slowly    Uterus: Normal    Adnexa: Normal   Assessment:  Post-Op 2 weeks s/p conization   Cervicitis Doing well postoperatively.   Plan:  1.Wound care discussed  ADD METROGEL 2. .Continue any current medications. 3. Activity restrictions: no sex 4. return to work: now. 5. Follow up in 4 weeks.

## 2014-09-12 ENCOUNTER — Encounter: Payer: Self-pay | Admitting: Obstetrics and Gynecology

## 2014-10-04 ENCOUNTER — Ambulatory Visit (INDEPENDENT_AMBULATORY_CARE_PROVIDER_SITE_OTHER): Payer: 59 | Admitting: Obstetrics and Gynecology

## 2014-10-04 ENCOUNTER — Encounter: Payer: Self-pay | Admitting: Obstetrics and Gynecology

## 2014-10-04 VITALS — BP 120/80 | Ht 64.0 in | Wt 172.0 lb

## 2014-10-04 DIAGNOSIS — Z9889 Other specified postprocedural states: Secondary | ICD-10-CM

## 2014-10-04 NOTE — Progress Notes (Signed)
Patient ID: Kathy FreezeJasmine D Brodzinski, female   DOB: [birth year], 34 y.o.   MRN: [id number] Pt here today for post op visit. Pt denies any problems or concerns at this time.

## 2014-10-04 NOTE — Progress Notes (Signed)
Patient ID: Cyndy FreezeJasmine D Gassner, female   DOB: September 26, 1980, 34 y.o.   MRN: [id number]  .   Subjective:  Cyndy FreezeJasmine D Odeh is a 34 y.o. female now 4 weeks status post conization of cervix. Patient reports she has been doing well. Review of Systems Negative except none r diet:   reg   Bowel movements : normal.  The patient is not having any pain.  Objective:  BP 120/80 mmHg  Ht 5\' 4"  (1.626 m)  Wt 172 lb (78.019 kg)  BMI 29.51 kg/m2 General:Well developed, well nourished.  No acute distress. Abdomen: Bowel sounds normal, soft, non-tender. Pelvic Exam:    External Genitalia:  Normal.    Vagina: Normal    Cervix: Normal with iud strings present visible normal length    Uterus:  Adnexa/Bimanual:  Incision(s):   Healing well, no drainage, no erythema, no hernia, no swelling, no dehiscence,          Notes Recorded by Tilda BurrowJohn Marquez Ceesay V, MD on 08/29/2014 at 5:23 PM The pathology report shows Both High grade and Low Grade abnormalities extending " to the Cauterized outer edge" which is where the laser was used . This was done under direct colposcope visualization, and the laser vaporized an area that extended BEYOND the edges of the abnormal areas, which could be easily seen on the colposcope. I feel the outer edges were clear of high grade abnormalities, but close monitoring is still advised. I would recommend looking at the cervix with the colposcopy at 3 months after this procedure           Assessment:  Post-Op 4 weeks s/p ckc   need colpo at 3 months Doing well postoperatively.   Plan:  1.Wound care discussed  And colpo to be scheduled 2. . current medications.n/a 3. Activity restrictions: none 4. return to work: not applicable. 5. Follow up in 8 weeks.

## 2014-12-04 ENCOUNTER — Other Ambulatory Visit: Payer: Self-pay | Admitting: Obstetrics and Gynecology

## 2014-12-04 ENCOUNTER — Ambulatory Visit (INDEPENDENT_AMBULATORY_CARE_PROVIDER_SITE_OTHER): Payer: 59 | Admitting: Obstetrics and Gynecology

## 2014-12-04 ENCOUNTER — Encounter: Payer: Self-pay | Admitting: Obstetrics and Gynecology

## 2014-12-04 VITALS — BP 140/90 | Ht 64.0 in | Wt 174.0 lb

## 2014-12-04 DIAGNOSIS — N900 Mild vulvar dysplasia: Secondary | ICD-10-CM

## 2014-12-04 DIAGNOSIS — R87612 Low grade squamous intraepithelial lesion on cytologic smear of cervix (LGSIL): Secondary | ICD-10-CM

## 2014-12-04 DIAGNOSIS — Z9889 Other specified postprocedural states: Secondary | ICD-10-CM

## 2014-12-04 NOTE — Progress Notes (Signed)
Patient ID: Cyndy FreezeJasmine D Bradley, female   DOB: 07-29-1980, 34 y.o.   MRN: [id number] Pt here today for follow up colposcopy.

## 2014-12-04 NOTE — Progress Notes (Addendum)
Patient ID: Kathy FreezeJasmine D Bradley, female   DOB: Nov 05, 1980, 34 y.o.   MRN: [id number]   Kathy FreezeJasmine D Bradley 34 y.o. [id number] here for colposcopy for high-grade squamous intraepithelial neoplasia  (HGSIL-encompassing moderate and severe dysplasia) pap smear in December 2015/January 2016. This is not pt's first abnormal pap. She had CONIZATION CERVIX WITH BIOPSY and HOLMIUM LASER APPLICATION on 08/22/2014 which was consistent with HGSIL. The biopsy specimen suggested CIN I involvement of exocervical margin.  There was a margin of tissue destroyed during specimen collection by the laser, so the possibility of uninvolved tissue at exocervical margin is a good possibility  Discussed role for HPV in cervical dysplasia, need for surveillance.  Patient given informed consent, signed copy in the chart, time out was performed.  Placed in lithotomy position. Cervix viewed with speculum and colposcope after application of acetic acid. IUD string in place.  Colposcopy adequate? Yes  small acetowhite lesion(s) noted at 12 o'clock; biopsies obtained at 12 o'clock.  All specimens were labelled and sent to pathology. Cervix eversion. Well-healed s/p conization; IUD strings in place. Anterior lip of cervix inverted. Area at anterior lip of cervix at 12 o'clock has a minimally whitened area that may represent surgical changes or LGSIL.   Colposcopy IMPRESSION: LGSIL likely; will follow-up in 1 year for pap if bx report returns as expected.  Patient was given post procedure instructions. Will follow up pathology and manage accordingly.  Routine preventative health maintenance measures emphasized.    This chart was scribed for Tilda BurrowJohn Drey Shaff V, MD by Gwenyth Oberatherine Macek, ED Scribe. This patient was seen in room 2 and the patient's care was started at 12:03 PM.   I personally performed the services described in this documentation, which was SCRIBED in my presence. The recorded information has been reviewed and considered accurate.  It has been edited as necessary during review. Tilda BurrowFERGUSON,Wardell Pokorski V, MD    PATH REPORT: BENIGN ENDOCERVIX AND SQUAMOUS METAPLASIA. PT WILL NEED CLOSE FOLLOWUP WITH ANNUAL PAPS AND HPV TESTING X 20 YRS, BUT NO RESIDUAL DISEASE IDENTIFIED AT THIS TIME. JVFERGUSON

## 2014-12-11 ENCOUNTER — Telehealth: Payer: Self-pay | Admitting: *Deleted

## 2014-12-11 NOTE — Telephone Encounter (Signed)
Pt informed of cervical biopsy results benign, yearly pap for 20 years per Dr. Emelda FearFerguson. Pt verbalized understanding.

## 2014-12-11 NOTE — Telephone Encounter (Signed)
-----   Message from Tilda BurrowJohn Ferguson V, MD sent at 12/10/2014  7:11 AM EDT ----- The biopsy is benign. Darolyn will need pap smears and HPV testing YEARLY , for 20 years, but no residual disease is identified at this time

## 2015-04-25 ENCOUNTER — Encounter: Payer: Self-pay | Admitting: Obstetrics and Gynecology

## 2015-05-02 ENCOUNTER — Other Ambulatory Visit: Payer: 59 | Admitting: Obstetrics and Gynecology

## 2015-07-09 ENCOUNTER — Other Ambulatory Visit (HOSPITAL_COMMUNITY)
Admission: RE | Admit: 2015-07-09 | Discharge: 2015-07-09 | Disposition: A | Discharge status: Home / Self Care | Payer: 59 | Source: Ambulatory Visit | Attending: Obstetrics and Gynecology | Admitting: Obstetrics and Gynecology

## 2015-07-09 ENCOUNTER — Ambulatory Visit (INDEPENDENT_AMBULATORY_CARE_PROVIDER_SITE_OTHER): Payer: 59 | Admitting: Obstetrics and Gynecology

## 2015-07-09 ENCOUNTER — Encounter: Payer: Self-pay | Admitting: Obstetrics and Gynecology

## 2015-07-09 VITALS — BP 118/70 | Ht 64.0 in | Wt 174.0 lb

## 2015-07-09 DIAGNOSIS — Z1151 Encounter for screening for human papillomavirus (HPV): Secondary | ICD-10-CM | POA: Diagnosis present

## 2015-07-09 DIAGNOSIS — Z9889 Other specified postprocedural states: Secondary | ICD-10-CM

## 2015-07-09 DIAGNOSIS — Z01419 Encounter for gynecological examination (general) (routine) without abnormal findings: Secondary | ICD-10-CM | POA: Diagnosis not present

## 2015-07-09 NOTE — Progress Notes (Signed)
Patient ID: Kathy FreezeJasmine D Blayney, female   DOB: 14-Feb-1981, 34 y.o.   MRN: [id number] Pt here today for annual exam. Pt denies any problems or concerns at this time.

## 2015-07-09 NOTE — Progress Notes (Signed)
Patient ID: Kathy Bradley, female   DOB: 1980-12-13, 34 y.o.   MRN: [id number]  Assessment:  Annual Gyn Exam 1. Discussed smoking cessation    Plan:  1. pap smear done, next pap due in 1 year 2. return annually or prn 3    Annual mammogram advised beginning at age 34 4.   Discussed self breast exams  Subjective:  Kathy Bradley is a 34 y.o. female G2P1011 who presents for annual exam. No LMP recorded. Patient is not currently having periods (Reason: IUD). The patient has no complaints today. Pt has h/o D&C, colposcopy vulva w/ biopsy, CKC on 08/22/14, holium laser application. Pt is a current occasional smoker and reports some prior success with cessation using a Nicotine patch. She denies recent weight loss, abnormal vaginal bleeding, night sweats.   Pt's last pap was in 2015 and indicated HGSIL CIN-2/ CIN-3/CIS. She underwent CKC in Feb  The following portions of the patient's history were reviewed and updated as appropriate: allergies, current medications, past family history, past medical history, past social history, past surgical history and problem list. Past Medical History  Diagnosis Date  . Abnormal Pap smear of cervix     Past Surgical History  Procedure Laterality Date  . Dilation and curettage of uterus    . Colposcopy vulva w/ biopsy    . Cervical conization w/bx N/A 08/22/2014    Procedure: CONIZATION CERVIX WITH BIOPSY;  Surgeon: Tilda BurrowJohn Retta Pitcher V, MD;  Location: [facility];  Service: Gynecology;  Laterality: N/A;  . Holmium laser application N/A 08/22/2014    Procedure: HOLMIUM LASER APPLICATION;  Surgeon: Tilda BurrowJohn Cameran Pettey V, MD;  Location: [facility];  Service: Gynecology;  Laterality: N/A;     Current outpatient prescriptions:  .  ibuprofen (ADVIL,MOTRIN) 200 MG tablet, Take 200 mg by mouth as needed., Disp: , Rfl:  .  levonorgestrel (MIRENA) 20 MCG/24HR IUD, 1 each by Intrauterine route once., Disp: , Rfl:   Review of Systems Constitutional: negative Gastrointestinal:  negative Genitourinary: negative  Objective:  BP 118/70 mmHg  Ht 5\' 4"  (1.626 m)  Wt 174 lb (78.926 kg)  BMI 29.85 kg/m2   BMI: Body mass index is 29.85 kg/(m^2).  General Appearance: Alert, appropriate appearance for age. No acute distress HEENT: Grossly normal Neck / Thyroid:  Cardiovascular: RRR; normal S1, S2, no murmur Lungs: CTA bilaterally Back: No CVAT Breast Exam: No masses or nodes.No dimpling, nipple retraction or discharge. Gastrointestinal: Soft, non-tender, no masses or organomegaly Pelvic Exam: Vulva and vagina appear normal. Bimanual exam reveals normal uterus and adnexa. External genitalia: normal general appearance Vaginal: normal mucosa without prolapse or lesions Cervix: normal appearance Adnexa: normal bimanual exam Uterus: normal single, nontender Rectovaginal: not indicated Lymphatic Exam: Non-palpable nodes in neck, clavicular, axillary, or inguinal regions  Skin: no rash or abnormalities Neurologic: Normal gait and speech, no tremor  Psychiatric: Alert and oriented, appropriate affect.  Urinalysis:Not done  Christin BachJohn Eziah Negro. MD Pgr [phone] 10:28 AM    By signing my name below, I, Doreatha MartinEva Mathews, attest that this documentation has been prepared under the direction and in the presence of Tilda BurrowJohn Allisen Pidgeon V, MD. Electronically Signed: Doreatha MartinEva Mathews, ED Scribe. 07/09/2015. 10:20 AM.  I personally performed the services described in this documentation, which was SCRIBED in my presence. The recorded information has been reviewed and considered accurate. It has been edited as necessary during review. Tilda BurrowFERGUSON,Hula Tasso V, MD

## 2015-07-11 ENCOUNTER — Other Ambulatory Visit: Payer: Self-pay | Admitting: Obstetrics and Gynecology

## 2015-07-11 DIAGNOSIS — A5901 Trichomonal vulvovaginitis: Secondary | ICD-10-CM

## 2015-07-11 LAB — CYTOLOGY - PAP

## 2015-07-11 MED ORDER — METRONIDAZOLE 500 MG PO TABS: 2000.0000 mg | ORAL_TABLET | Freq: Once | ORAL | Status: DC

## 2015-07-12 ENCOUNTER — Telehealth: Payer: Self-pay | Admitting: Obstetrics and Gynecology

## 2015-07-12 ENCOUNTER — Telehealth: Payer: Self-pay | Admitting: *Deleted

## 2015-07-12 NOTE — Telephone Encounter (Signed)
Pt called stating that she would like to know the results of her pap, please contact pt °

## 2015-07-12 NOTE — Telephone Encounter (Signed)
Pt informed pap showed Trich from 07/09/2015, Rx for Flagyl sent to pt pharmacy, no sex for 2 weeks,  proof of cure in 3 weeks. Pt states will call our office back to schedule POC appt.    Pt states can she get Trich from [facility]. Pt informed per Cathie BeamsFran Cresenzo-Dishmon, CNM cannot get trich from [facility]. Pt verbalized understanding.

## 2015-07-12 NOTE — Telephone Encounter (Addendum)
[facility] [place] unable to fill Rx for Metronidazole as prescribed for both pt and partner will need separate Rx for the pt each.  Partners name: Stormy FabianRonnie Seay  DOB: 10/05/1975  Metronidazole 500 mg, take 4 tablets by mouth once, #4, 1 refill, [facility] [place] per Dr. Emelda FearFerguson. Pt informed.   Pt states can her partner Stormy FabianRonnie Seay take the Metronidazole with heart stents, Plavix, ASA, Lipitor. Per Dr. Despina HiddenEure is safe to take Metronidazole with these meds and heart stent. Pt verbalized understanding.

## 2015-12-05 ENCOUNTER — Encounter: Payer: Self-pay | Admitting: Family Medicine

## 2015-12-05 ENCOUNTER — Ambulatory Visit (INDEPENDENT_AMBULATORY_CARE_PROVIDER_SITE_OTHER): Payer: BLUE CROSS/BLUE SHIELD | Admitting: Family Medicine

## 2015-12-05 VITALS — BP 149/107 | HR 91 | Temp 97.7°F | Ht 64.0 in | Wt 172.4 lb

## 2015-12-05 DIAGNOSIS — M542 Cervicalgia: Secondary | ICD-10-CM | POA: Diagnosis not present

## 2015-12-05 MED ORDER — CYCLOBENZAPRINE HCL 10 MG PO TABS
10.0000 mg | ORAL_TABLET | Freq: Three times a day (TID) | ORAL | Status: DC | PRN
Start: 2015-12-05 — End: 2015-12-21
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
## 2015-12-07 ENCOUNTER — Encounter: Payer: Self-pay | Admitting: Family Medicine

## 2015-12-07 ENCOUNTER — Encounter: Payer: Self-pay | Admitting: *Deleted

## 2015-12-11 ENCOUNTER — Encounter: Payer: Self-pay | Admitting: Physical Therapy

## 2015-12-11 ENCOUNTER — Ambulatory Visit: Payer: BLUE CROSS/BLUE SHIELD | Attending: Family Medicine | Admitting: Physical Therapy

## 2015-12-11 DIAGNOSIS — R293 Abnormal posture: Secondary | ICD-10-CM | POA: Insufficient documentation

## 2015-12-11 DIAGNOSIS — M542 Cervicalgia: Secondary | ICD-10-CM | POA: Diagnosis not present
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
## 2015-12-11 NOTE — Patient Instructions (Signed)
  Posture - Sitting   Sit upright, head facing forward. Try using a roll to support lower back. Keep shoulders relaxed, and avoid rounded back. Keep hips level with knees. Avoid crossing legs for long periods.   Flexibility: Corner Stretch   Standing in corner or a doorway with hands just above shoulder level.  Lean forward until a comfortable stretch is felt across chest. Hold __30__ seconds. Repeat __3__ times per set.  Do _2___ sessions per day.  http://orth.exer.us/342   Copyright  VHI. All rights reserved.   NECK RANGE OF MOTION: Do 10-20 times each, 3 times per day  1. ROTATION - turn your head side to side gently 2. SIDEBEND - bring your ear to your shoulder gently 3. FLEX/EXTENSION -  Look up and down gently  Solon PalmJulie Vadie Principato, PT 12/11/2015 11:13 AM [facility] [address] [place], [place], [address] Phone: [phone]   Fax:  [phone]

## 2015-12-13 ENCOUNTER — Encounter: Payer: Self-pay | Admitting: Physical Therapy

## 2015-12-13 ENCOUNTER — Ambulatory Visit: Payer: BLUE CROSS/BLUE SHIELD | Admitting: Physical Therapy

## 2015-12-13 DIAGNOSIS — R293 Abnormal posture: Secondary | ICD-10-CM

## 2015-12-13 DIAGNOSIS — M542 Cervicalgia: Secondary | ICD-10-CM | POA: Diagnosis not present

## 2015-12-13 NOTE — Therapy (Signed)
[facility] [address] [place], [place], [address] Phone: [phone]   Fax:  [phone]  Physical Therapy Treatment  Patient Details  Name: Kathy Bradley MRN: [id number] Date of Birth: 05-23-1981 Referring Provider: Jacalyn Lefevre, MD  Encounter Date: 12/13/2015      PT End of Session - 12/13/15 0731    Visit Number 2   Number of Visits 12   Date for PT Re-Evaluation 01/22/16   PT Start Time 0733   PT Stop Time 0816   PT Time Calculation (min) 43 min   Activity Tolerance Patient tolerated treatment well   Behavior During Therapy [facility] for tasks assessed/performed      Past Medical History  Diagnosis Date  . Abnormal Pap smear of cervix     Past Surgical History  Procedure Laterality Date  . Dilation and curettage of uterus    . Colposcopy vulva w/ biopsy    . Cervical conization w/bx N/A 08/22/2014    Procedure: CONIZATION CERVIX WITH BIOPSY;  Surgeon: Tilda Burrow, MD;  Location: [facility];  Service: Gynecology;  Laterality: N/A;  . Holmium laser application N/A 08/22/2014    Procedure: HOLMIUM LASER APPLICATION;  Surgeon: Tilda Burrow, MD;  Location: [facility];  Service: Gynecology;  Laterality: N/A;    There were no vitals filed for this visit.      Subjective Assessment - 12/13/15 0731    Subjective (p) Reports burning following estim last treatment but felt better yesterday and attempted activities to which pain began again last night.   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 gets dizzy   Patient Stated Goals get back to normal life and back to work   Currently in Pain? (p) Yes   Pain Score (p) 5    Pain Location (p) Neck   Pain Orientation (p) Left   Pain Descriptors / Indicators (p) Burning;Aching;Nagging   Pain Type (p) Acute pain   Pain Onset (p) 1 to 4 weeks ago             [facility] PT Assessment - 12/13/15 0001    Assessment   Medical Diagnosis Neck pain   Onset Date/Surgical Date 12/03/15   Hand Dominance Right   Next MD Visit not scheduled   Precautions   Required Braces or Orthoses Cervical Brace   Cervical Brace Soft collar                     [facility] Adult PT Treatment/Exercise - 12/13/15 0001    Modalities   Modalities Electrical Stimulation;Moist Heat;Ultrasound   Moist Heat Therapy   Number Minutes Moist Heat 15 Minutes   Moist Heat Location Cervical;Shoulder   Electrical Stimulation   Electrical Stimulation Location L UT/ Levator Scapula   Electrical Stimulation Action IFC   Electrical Stimulation Parameters 80-150 Hz x15 min   Electrical Stimulation Goals Pain;Tone   Ultrasound   Ultrasound Location L UT   Ultrasound Parameters 1.2 w/cm2, 100%,1 mhzx10 min   Ultrasound Goals Pain   Manual Therapy   Manual Therapy Myofascial release   Myofascial Release MFR/STW to L UT/ Thoracic and cervical paraspinals/ Levator Scapula to decrease pain and tightness                     PT Long Term Goals - 12/13/15 9563  PT LONG TERM GOAL #1   Title I with HEP   Time 6   Period Weeks   Status Achieved   PT LONG TERM GOAL #2   Title Patient able to move neck without pain.   Time 6   Period Weeks   Status On-going   PT LONG TERM GOAL #3   Title Improved cervical ROM to [facility] for ADLS   Time 6   Period Weeks   Status On-going   PT LONG TERM GOAL #4   Title Patient able to sleep without meds.   Time 6   Period Weeks   Status On-going   PT LONG TERM GOAL #5   Title Patient able to peform ADLs with 2/10 pain or less.   Time 6   Period Weeks   Status On-going               Plan - 12/13/15 0807    Clinical Impression Statement Patient tolerated today's treatment well and only had reports of tenderness during manual therapy. Patient experienced a burning sensation in L shoulder and under scapula following  electrical stimulation in previous treatment. Patient presented with increased tightness especially in L UT and Levator Scapula region. Patient experienced soreness to palpation along superior border of L scapula and into lower cervical paraspinals region. Normal modalities response noted following removal of the modalities. Educated patient that she could use heating pad at home for 15-20 minutes to alleviate tightness in L cervical musculature. Patient reported compliance with HEP although she experiences popping in cervical region. Patient denied burning with electrical stimulation today and reported discomfort around inferior angle of the L Scapula at end of treatment.   Rehab Potential Excellent   PT Frequency 2x / week   PT Duration 6 weeks   PT Treatment/Interventions ADLs/Self Care Home Management;Electrical Stimulation;Moist Heat;Therapeutic exercise;Ultrasound;Neuromuscular re-education;Patient/family education;Manual techniques;Dry needling;Passive range of motion   PT Next Visit Plan Modalities to manage pain, manual/STW to L upper quadrant/neck, Cervical ROM; postural strengthening as tolerated.   PT Home Exercise Plan  cervical ROM and pec stretch   Consulted and Agree with Plan of Care Patient      Patient will benefit from skilled therapeutic intervention in order to improve the following deficits and impairments:  Decreased range of motion, Pain, Decreased strength, Postural dysfunction  Visit Diagnosis: Cervicalgia  Abnormal posture     Problem List Patient Active Problem List   Diagnosis Date Noted  . 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

## 2015-12-18 ENCOUNTER — Ambulatory Visit: Payer: BLUE CROSS/BLUE SHIELD | Admitting: *Deleted

## 2015-12-18 ENCOUNTER — Encounter: Payer: Self-pay | Admitting: Family Medicine

## 2015-12-18 DIAGNOSIS — R293 Abnormal posture: Secondary | ICD-10-CM

## 2015-12-18 DIAGNOSIS — M542 Cervicalgia: Secondary | ICD-10-CM | POA: Diagnosis not present
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
## 2015-12-20 ENCOUNTER — Encounter: Payer: BLUE CROSS/BLUE SHIELD | Admitting: Physical Therapy

## 2015-12-21 ENCOUNTER — Other Ambulatory Visit: Payer: Self-pay | Admitting: Family Medicine

## 2015-12-21 ENCOUNTER — Telehealth: Payer: Self-pay | Admitting: Family Medicine

## 2015-12-21 MED ORDER — CYCLOBENZAPRINE HCL 10 MG PO TABS: 10.0000 mg | ORAL_TABLET | Freq: Three times a day (TID) | ORAL | Status: DC | PRN

## 2015-12-21 NOTE — Addendum Note (Signed)
Addended by: Magdalene RiverBULLINS, Thaila Bottoms H on: 12/21/2015 12:48 PM   Modules accepted: Orders

## 2015-12-23 NOTE — Telephone Encounter (Signed)
I do not recall writing a work note.  Need options from work place and job responsibilities

## 2015-12-23 NOTE — Telephone Encounter (Signed)
Since she is in PT, they may have some thoughts regarding limitations

## 2015-12-24 ENCOUNTER — Ambulatory Visit: Payer: BLUE CROSS/BLUE SHIELD | Attending: Family Medicine | Admitting: *Deleted

## 2015-12-24 DIAGNOSIS — M542 Cervicalgia: Secondary | ICD-10-CM | POA: Diagnosis not present

## 2015-12-24 DIAGNOSIS — R293 Abnormal posture: Secondary | ICD-10-CM | POA: Diagnosis present
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
## 2015-12-25 ENCOUNTER — Telehealth: Payer: Self-pay | Admitting: Family Medicine

## 2015-12-25 ENCOUNTER — Encounter: Payer: Self-pay | Admitting: Family Medicine

## 2015-12-25 NOTE — Telephone Encounter (Signed)
Spoke with pt and she will get in touch with the Corporate Office to see exactly what details are needed and get back to us.

## 2015-12-25 NOTE — Telephone Encounter (Signed)
FYI

## 2015-12-27 ENCOUNTER — Ambulatory Visit: Payer: BLUE CROSS/BLUE SHIELD | Admitting: *Deleted

## 2015-12-27 DIAGNOSIS — R293 Abnormal posture: Secondary | ICD-10-CM

## 2015-12-27 DIAGNOSIS — M542 Cervicalgia: Secondary | ICD-10-CM | POA: Diagnosis not present
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
## 2015-12-28 ENCOUNTER — Encounter: Payer: Self-pay | Admitting: *Deleted

## 2015-12-28 ENCOUNTER — Ambulatory Visit (INDEPENDENT_AMBULATORY_CARE_PROVIDER_SITE_OTHER): Payer: BLUE CROSS/BLUE SHIELD | Admitting: Family Medicine

## 2015-12-28 ENCOUNTER — Encounter: Payer: Self-pay | Admitting: Family Medicine

## 2015-12-28 VITALS — BP 133/93 | HR 112 | Temp 99.1°F | Ht 64.0 in | Wt 172.0 lb

## 2015-12-28 DIAGNOSIS — M542 Cervicalgia: Secondary | ICD-10-CM | POA: Diagnosis not present

## 2015-12-28 DIAGNOSIS — I10 Essential (primary) hypertension: Secondary | ICD-10-CM

## 2015-12-28 MED ORDER — NEBIVOLOL HCL 5 MG PO TABS: 5.0000 mg | ORAL_TABLET | Freq: Every day | ORAL | Status: DC
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
## 2015-12-31 ENCOUNTER — Ambulatory Visit: Payer: BLUE CROSS/BLUE SHIELD | Admitting: *Deleted

## 2015-12-31 DIAGNOSIS — M542 Cervicalgia: Secondary | ICD-10-CM | POA: Diagnosis not present

## 2015-12-31 DIAGNOSIS — R293 Abnormal posture: Secondary | ICD-10-CM
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
## 2016-01-03 ENCOUNTER — Ambulatory Visit: Payer: BLUE CROSS/BLUE SHIELD | Admitting: *Deleted

## 2016-01-03 DIAGNOSIS — M542 Cervicalgia: Secondary | ICD-10-CM

## 2016-01-03 DIAGNOSIS — R293 Abnormal posture: Secondary | ICD-10-CM
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
## 2016-01-06 ENCOUNTER — Other Ambulatory Visit: Payer: Self-pay | Admitting: Family Medicine

## 2016-01-07 ENCOUNTER — Ambulatory Visit: Payer: BLUE CROSS/BLUE SHIELD | Admitting: *Deleted

## 2016-01-07 DIAGNOSIS — M542 Cervicalgia: Secondary | ICD-10-CM | POA: Diagnosis not present

## 2016-01-07 DIAGNOSIS — R293 Abnormal posture: Secondary | ICD-10-CM

## 2016-01-07 MED ORDER — CYCLOBENZAPRINE HCL 10 MG PO TABS: 10.0000 mg | ORAL_TABLET | Freq: Three times a day (TID) | ORAL | Status: DC | PRN
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
## 2016-01-08 ENCOUNTER — Telehealth: Payer: Self-pay

## 2016-01-08 NOTE — Telephone Encounter (Signed)
Insurance denied Bystolic  Alternatives that need to be tried are Atenolol, Carvedilol, metoprolol

## 2016-01-08 NOTE — Telephone Encounter (Signed)
Let's do carvedilol, 6.25 mg BID

## 2016-01-09 MED ORDER — CARVEDILOL 6.25 MG PO TABS: 6.2500 mg | ORAL_TABLET | Freq: Two times a day (BID) | ORAL | Status: DC

## 2016-01-09 NOTE — Addendum Note (Signed)
Addended by: Caryl BisBOWMAN, Mavryk Pino M on: 01/09/2016 08:07 AM   Modules accepted: Orders

## 2016-01-10 ENCOUNTER — Ambulatory Visit: Payer: BLUE CROSS/BLUE SHIELD | Admitting: Physical Therapy

## 2016-01-10 DIAGNOSIS — M542 Cervicalgia: Secondary | ICD-10-CM

## 2016-01-10 DIAGNOSIS — R293 Abnormal posture: Secondary | ICD-10-CM

## 2016-01-10 NOTE — Patient Instructions (Signed)

## 2016-01-10 NOTE — Therapy (Signed)
[facility] [address] [place], [place], [address] Phone: [phone]   Fax:  [phone]  Physical Therapy Treatment  Patient Details  Name: Kathy Bradley MRN: [id number] Date of Birth: [birth year] Referring Provider: Jacalyn LefevreStephen Miller, MD  Encounter Date: 01/10/2016      PT End of Session - 01/10/16 1033    Visit Number 9   Number of Visits 12   Date for PT Re-Evaluation 01/22/16   PT Start Time 0945   PT Stop Time 1044   PT Time Calculation (min) 59 min   Activity Tolerance Patient tolerated treatment well   Behavior During Therapy [facility] for tasks assessed/performed      Past Medical History  Diagnosis Date  . Abnormal Pap smear of cervix     Past Surgical History  Procedure Laterality Date  . Dilation and curettage of uterus    . Colposcopy vulva w/ biopsy    . Cervical conization w/bx N/A 08/22/2014    Procedure: CONIZATION CERVIX WITH BIOPSY;  Surgeon: Tilda BurrowJohn Ferguson V, MD;  Location: [facility];  Service: Gynecology;  Laterality: N/A;  . Holmium laser application N/A 08/22/2014    Procedure: HOLMIUM LASER APPLICATION;  Surgeon: Tilda BurrowJohn Ferguson V, MD;  Location: [facility];  Service: Gynecology;  Laterality: N/A;    There were no vitals filed for this visit.      Subjective Assessment - 01/10/16 0950    Subjective Patient reports 70% improvement overall. She is much better than 3 days ago.    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 gets dizzy   Patient Stated Goals get back to normal life and back to work   Currently in Pain? Yes   Pain Score 5    Pain Location Neck   Pain Orientation Left   Pain Descriptors / Indicators Aching;Sharp   Pain Type Acute pain   Pain Onset More than a month ago   Pain Frequency Constant   Aggravating Factors  moving head   Pain Relieving Factors rest                          [facility] Adult PT Treatment/Exercise - 01/10/16 0001    Exercises   Exercises Neck   Neck Exercises: Seated   Shoulder Shrugs 10 reps   Shoulder Shrugs Limitations left shoulder delayed and poor quality of movement   Neck Exercises: Supine   Other Supine Exercise placing SCM in relaxed position for HEP   Electrical Stimulation   Electrical Stimulation Location IFC to L neck and shoulder complex x 15 min to tolerance at 80-150 Hz  supine   Electrical Stimulation Goals Pain;Tone   Manual Therapy   Manual Therapy Soft tissue mobilization;Myofascial release   Soft tissue mobilization to L UT, levator scap, scalenes and SCM   Myofascial Release to same as above          Trigger Point Dry Needling - 01/10/16 1037    Consent Given? Yes   Education Handout Provided Yes   Muscles Treated Upper Body Upper trapezius   Upper Trapezius Response Twitch reponse elicited;Palpable increased muscle length              PT Education - 01/10/16 1038    Education provided Yes   Education Details HEP and dry needling education including aftercare   Person(s)  Educated Patient   Methods Explanation;Demonstration;Handout   Comprehension Verbalized understanding;Returned demonstration             PT Long Term Goals - 12/13/15 0805    PT LONG TERM GOAL #1   Title I with HEP   Time 6   Period Weeks   Status Achieved   PT LONG TERM GOAL #2   Title Patient able to move neck without pain.   Time 6   Period Weeks   Status On-going   PT LONG TERM GOAL #3   Title Improved cervical ROM to [facility] for ADLS   Time 6   Period Weeks   Status On-going   PT LONG TERM GOAL #4   Title Patient able to sleep without meds.   Time 6   Period Weeks   Status On-going   PT LONG TERM GOAL #5   Title Patient able to peform ADLs with 2/10 pain or less.   Time 6   Period Weeks   Status On-going               Plan - 01/10/16 1038    Clinical Impression  Statement Patient reports 70% improvement overall. She has marked tightness in L SCM, scalenes and UT with active and latent trigger points throughout. She responded well to DN in UT and was educated in manual TP release for SCM. LTGs are ongoing. Patient wants to get a sling to support her L arm with ADLs. PT advised not to keep arm in sling all day and to begin UT and retraction strengthening.   Rehab Potential Excellent   PT Frequency 2x / week   PT Duration 6 weeks   PT Treatment/Interventions ADLs/Self Care Home Management;Electrical Stimulation;Moist Heat;Therapeutic exercise;Ultrasound;Neuromuscular re-education;Patient/family education;Manual techniques;Dry needling;Passive range of motion   PT Next Visit Plan Assess DN. Modalities to manage pain, manual/STW to L upper quadrant/neck (focus on SCM and scalenes), Cervical ROM; postural strengthening as tolerated.   PT Home Exercise Plan SCM in shortened position to relax muscle and shoulder shrugs.   Consulted and Agree with Plan of Care Patient      Patient will benefit from skilled therapeutic intervention in order to improve the following deficits and impairments:  Decreased range of motion, Pain, Decreased strength, Postural dysfunction  Visit Diagnosis: Cervicalgia  Abnormal posture     Problem List Patient Active Problem List   Diagnosis Date Noted  . 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

## 2016-01-14 ENCOUNTER — Ambulatory Visit: Payer: BLUE CROSS/BLUE SHIELD | Admitting: *Deleted

## 2016-01-14 DIAGNOSIS — M542 Cervicalgia: Secondary | ICD-10-CM

## 2016-01-14 DIAGNOSIS — R293 Abnormal posture: Secondary | ICD-10-CM

## 2016-01-14 NOTE — Therapy (Signed)
[facility] [address] [place], [place], [address] Phone: [phone]   Fax:  [phone]  Physical Therapy Treatment  Patient Details  Name: Kathy Bradley MRN: [id number] Date of Birth: 24-Feb-1981 Referring Provider: Jacalyn LefevreStephen Miller, MD  Encounter Date: 01/14/2016      PT End of Session - 01/14/16 1357    Visit Number 10   Number of Visits 12   Date for PT Re-Evaluation 01/22/16   PT Start Time 1300   PT Stop Time 1359   PT Time Calculation (min) 59 min      Past Medical History  Diagnosis Date  . Abnormal Pap smear of cervix     Past Surgical History  Procedure Laterality Date  . Dilation and curettage of uterus    . Colposcopy vulva w/ biopsy    . Cervical conization w/bx N/A 08/22/2014    Procedure: CONIZATION CERVIX WITH BIOPSY;  Surgeon: Tilda BurrowJohn Ferguson V, MD;  Location: [facility];  Service: Gynecology;  Laterality: N/A;  . Holmium laser application N/A 08/22/2014    Procedure: HOLMIUM LASER APPLICATION;  Surgeon: Tilda BurrowJohn Ferguson V, MD;  Location: [facility];  Service: Gynecology;  Laterality: N/A;    There were no vitals filed for this visit.      Subjective Assessment - 01/14/16 1352    Subjective Patient reports 70% improvement overall. She is much better than 3 days ago. Did well with needling   Pertinent History HTN, Cervical biopsy   Limitations Sitting;Standing   How long can you sit comfortably? 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 gets dizzy   Patient Stated Goals get back to normal life and back to work   Currently in Pain? Yes   Pain Score 5    Pain Location Neck   Pain Orientation Left   Pain Descriptors / Indicators Aching   Pain Type Acute pain   Pain Onset More than a month ago   Pain Frequency Constant                         [facility] Adult PT Treatment/Exercise - 01/14/16 0001    Exercises   Exercises Neck   Modalities   Modalities Electrical Stimulation;Moist  Heat;Ultrasound   Moist Heat Therapy   Number Minutes Moist Heat 15 Minutes   Moist Heat Location Cervical;Shoulder   Electrical Stimulation   Electrical Stimulation Location IFC to L neck and shoulder complex x 15 min to tolerance at 80-150 Hz  supine   Electrical Stimulation Goals Pain;Tone   Ultrasound   Ultrasound Location :LT , UT, Levator   Ultrasound Parameters combo 1.5 w/cm2 x 10 mins in sitting   Ultrasound Goals Pain   Manual Therapy   Manual Therapy Soft tissue mobilization;Myofascial release;Passive ROM   Soft tissue mobilization to L UT, levator scap, scalenes and SCM   Passive ROM light manual traction and PROM for cerv. rotation in supine                     PT Long Term Goals - 12/13/15 0805    PT LONG TERM GOAL #1   Title I with HEP   Time 6   Period Weeks   Status Achieved   PT LONG TERM GOAL #2   Title Patient able to move neck without pain.   Time 6   Period Weeks   Status On-going   PT LONG TERM GOAL #3  Title Improved cervical ROM to [facility] for ADLS   Time 6   Period Weeks   Status On-going   PT LONG TERM GOAL #4   Title Patient able to sleep without meds.   Time 6   Period Weeks   Status On-going   PT LONG TERM GOAL #5   Title Patient able to peform ADLs with 2/10 pain or less.   Time 6   Period Weeks   Status On-going               Plan - 01/14/16 1359    Clinical Impression Statement Pt did fairly well todaywith Rx. She was able to tolerate deeper pressure with STW and TPR. She also did well with Light manual traction and cervical rotation PROM in supine. Was able to get TP in UT to release today and Pt did well in response to modalities. Goals on-going.   Rehab Potential Excellent   PT Frequency 2x / week   PT Duration 6 weeks   PT Treatment/Interventions ADLs/Self Care Home Management;Electrical Stimulation;Moist Heat;Therapeutic exercise;Ultrasound;Neuromuscular re-education;Patient/family education;Manual  techniques;Dry needling;Passive range of motion   PT Next Visit Plan Assess DN. Modalities to manage pain, manual/STW to L upper quadrant/neck (focus on SCM and scalenes), Cervical ROM; postural strengthening as tolerated.   PT Home Exercise Plan SCM in shortened position to relax muscle and shoulder shrugs.   Consulted and Agree with Plan of Care Patient      Patient will benefit from skilled therapeutic intervention in order to improve the following deficits and impairments:  Decreased range of motion, Pain, Decreased strength, Postural dysfunction  Visit Diagnosis: Cervicalgia  Abnormal posture     Problem List Patient Active Problem List   Diagnosis Date Noted  . 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

## 2016-01-17 ENCOUNTER — Other Ambulatory Visit: Payer: Self-pay | Admitting: Family Medicine

## 2016-01-17 ENCOUNTER — Ambulatory Visit: Payer: BLUE CROSS/BLUE SHIELD | Admitting: *Deleted

## 2016-01-17 ENCOUNTER — Telehealth: Payer: Self-pay | Admitting: Family Medicine

## 2016-01-17 DIAGNOSIS — M542 Cervicalgia: Secondary | ICD-10-CM

## 2016-01-17 DIAGNOSIS — R293 Abnormal posture: Secondary | ICD-10-CM

## 2016-01-17 NOTE — Telephone Encounter (Signed)
Pt would prefer to see Dr.Miller earlier. Appt given for 7/6 at 2:30.

## 2016-01-17 NOTE — Telephone Encounter (Signed)
Continue physical therapy until she sees Dr. Hyacinth MeekerMiller

## 2016-01-17 NOTE — Therapy (Signed)
[facility] [address] [place], [place], [address] Phone: [phone]   Fax:  [phone]  Physical Therapy Treatment  Patient Details  Name: Kathy Bradley MRN: [id number] Date of Birth: 01-May-1981 Referring Provider: Jacalyn LefevreStephen Miller, MD  Encounter Date: 01/17/2016      PT End of Session - 01/17/16 0951    Visit Number 11   Number of Visits 12   Date for PT Re-Evaluation 01/22/16   PT Start Time 0945   PT Stop Time 1046   PT Time Calculation (min) 61 min   Activity Tolerance Patient tolerated treatment well      Past Medical History  Diagnosis Date  . Abnormal Pap smear of cervix     Past Surgical History  Procedure Laterality Date  . Dilation and curettage of uterus    . Colposcopy vulva w/ biopsy    . Cervical conization w/bx N/A 08/22/2014    Procedure: CONIZATION CERVIX WITH BIOPSY;  Surgeon: Tilda BurrowJohn Ferguson V, MD;  Location: [facility];  Service: Gynecology;  Laterality: N/A;  . Holmium laser application N/A 08/22/2014    Procedure: HOLMIUM LASER APPLICATION;  Surgeon: Tilda BurrowJohn Ferguson V, MD;  Location: [facility];  Service: Gynecology;  Laterality: N/A;    There were no vitals filed for this visit.      Subjective Assessment - 01/17/16 0950    Subjective Patient reports 70% improvement overall. She is much better than 3 days ago. Did well with needling   Pertinent History HTN, Cervical biopsy   Limitations Sitting;Standing   How long can you sit comfortably? 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 gets dizzy   Patient Stated Goals get back to normal life and back to work   Currently in Pain? Yes   Pain Score 4    Pain Location Neck   Pain Orientation Left   Pain Descriptors / Indicators Aching   Pain Type Acute pain   Pain Onset More than a month ago   Pain Frequency Constant            [facility] PT Assessment - 01/17/16 0001    AROM   Cervical - Right Rotation 70   Cervical - Left Rotation 70                      [facility] Adult PT Treatment/Exercise - 01/17/16 0001    Exercises   Exercises Neck   Modalities   Modalities Electrical Stimulation;Moist Heat;Ultrasound   Moist Heat Therapy   Number Minutes Moist Heat 15 Minutes   Moist Heat Location Cervical;Shoulder   Electrical Stimulation   Electrical Stimulation Location IFC to L neck and shoulder complex x 15 min to tolerance at 80-150 Hz  supine   Electrical Stimulation Goals Pain;Tone   Ultrasound   Ultrasound Location L UT,levator, and cervical paras   Ultrasound Parameters combo 1.5 w/cm2x 10 mins sitting   Ultrasound Goals Pain   Manual Therapy   Manual Therapy Soft tissue mobilization;Myofascial release;Passive ROM   Soft tissue mobilization to L UT, levator scap, scalenes and SCM   Passive ROM light manual traction and PROM for cerv. rotation in supine                     PT Long Term Goals - 12/13/15 0805    PT LONG TERM GOAL #1   Title I with HEP   Time 6   Period Weeks   Status  Achieved   PT LONG TERM GOAL #2   Title Patient able to move neck without pain.   Time 6   Period Weeks   Status On-going   PT LONG TERM GOAL #3   Title Improved cervical ROM to [facility] for ADLS   Time 6   Period Weeks   Status On-going   PT LONG TERM GOAL #4   Title Patient able to sleep without meds.   Time 6   Period Weeks   Status On-going   PT LONG TERM GOAL #5   Title Patient able to peform ADLs with 2/10 pain or less.   Time 6   Period Weeks   Status On-going               Plan - 01/17/16 1004    Clinical Impression Statement Pt did fairly well today and was able to tolerate Rx with mild increase i pain. Her cervical ROM continues to improve with Rotation RT to 70 degrees  and Rotation LT to 70 degrees as well.  She is able to do a little more around the house now with less pain.  She still has notble trigger points along  LT  side of neck around C3-5 that have decreased in size, but not  eliminated.    Goals are still ongoing  due to pain and tightness.   Rehab Potential Excellent   PT Frequency 2x / week   PT Duration 6 weeks   PT Treatment/Interventions ADLs/Self Care Home Management;Electrical Stimulation;Moist Heat;Therapeutic exercise;Ultrasound;Neuromuscular re-education;Patient/family education;Manual techniques;Dry needling;Passive range of motion   PT Next Visit Plan Assess DN. Modalities to manage pain, manual/STW to L upper quadrant/neck (focus on SCM and scalenes), Cervical ROM; postural strengthening as tolerated.   PT Home Exercise Plan SCM in shortened position to relax muscle and shoulder shrugs.      Patient will benefit from skilled therapeutic intervention in order to improve the following deficits and impairments:  Decreased range of motion, Pain, Decreased strength, Postural dysfunction  Visit Diagnosis: Cervicalgia  Abnormal posture     Problem List Patient Active Problem List   Diagnosis Date Noted  . 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 [place], [place], [address] Phone: [phone]   Fax:  [phone]  Name: Kathy Bradley MRN: [id number] Date of Birth: 1981/07/13

## 2016-01-17 NOTE — Telephone Encounter (Signed)
I am not familiar with this patient. I did okay her to continue physical therapy until she sees Dr. Hyacinth MeekerMiller. She can take ibuprofen over-the-counter or Aleve instead of the Flexeril as this may help some of the inflammation at this point. That way she can leave off the Flexeril.

## 2016-01-21 ENCOUNTER — Ambulatory Visit: Payer: BLUE CROSS/BLUE SHIELD | Attending: Family Medicine | Admitting: *Deleted

## 2016-01-21 DIAGNOSIS — R293 Abnormal posture: Secondary | ICD-10-CM | POA: Insufficient documentation

## 2016-01-21 DIAGNOSIS — M542 Cervicalgia: Secondary | ICD-10-CM | POA: Insufficient documentation

## 2016-01-21 NOTE — Therapy (Signed)
[facility] [address] [place], [place], [address] Phone: [phone]   Fax:  [phone]  Physical Therapy Treatment  Patient Details  Name: Kathy Bradley MRN: [id number] Date of Birth: 1980-08-19 Referring Provider: Jacalyn LefevreStephen Miller, MD  Encounter Date: 01/21/2016    Past Medical History  Diagnosis Date  . Abnormal Pap smear of cervix     Past Surgical History  Procedure Laterality Date  . Dilation and curettage of uterus    . Colposcopy vulva w/ biopsy    . Cervical conization w/bx N/A 08/22/2014    Procedure: CONIZATION CERVIX WITH BIOPSY;  Surgeon: Tilda BurrowJohn Ferguson V, MD;  Location: [facility];  Service: Gynecology;  Laterality: N/A;  . Holmium laser application N/A 08/22/2014    Procedure: HOLMIUM LASER APPLICATION;  Surgeon: Tilda BurrowJohn Ferguson V, MD;  Location: [facility];  Service: Gynecology;  Laterality: N/A;    There were no vitals filed for this visit.                                    PT Long Term Goals - 12/13/15 0805    PT LONG TERM GOAL #1   Title I with HEP   Time 6   Period Weeks   Status Achieved   PT LONG TERM GOAL #2   Title Patient able to move neck without pain.   Time 6   Period Weeks   Status On-going   PT LONG TERM GOAL #3   Title Improved cervical ROM to [facility] for ADLS   Time 6   Period Weeks   Status On-going   PT LONG TERM GOAL #4   Title Patient able to sleep without meds.   Time 6   Period Weeks   Status On-going   PT LONG TERM GOAL #5   Title Patient able to peform ADLs with 2/10 pain or less.   Time 6   Period Weeks   Status On-going             Patient will benefit from skilled therapeutic intervention in order to improve the following deficits and impairments:  Decreased range of motion, Pain, Decreased strength, Postural dysfunction  Visit Diagnosis: Cervicalgia  Abnormal posture     Problem List Patient Active Problem List   Diagnosis Date Noted   . 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

## 2016-01-24 ENCOUNTER — Encounter: Payer: BLUE CROSS/BLUE SHIELD | Admitting: Physical Therapy

## 2016-01-24 ENCOUNTER — Ambulatory Visit (INDEPENDENT_AMBULATORY_CARE_PROVIDER_SITE_OTHER): Payer: BLUE CROSS/BLUE SHIELD | Admitting: Family Medicine

## 2016-01-24 ENCOUNTER — Encounter: Payer: Self-pay | Admitting: Family Medicine

## 2016-01-24 VITALS — BP 135/98 | HR 95 | Temp 98.0°F | Ht 64.0 in | Wt 172.6 lb

## 2016-01-24 DIAGNOSIS — M542 Cervicalgia: Secondary | ICD-10-CM | POA: Diagnosis not present

## 2016-01-24 MED ORDER — CYCLOBENZAPRINE HCL 10 MG PO TABS: 10.0000 mg | ORAL_TABLET | Freq: Three times a day (TID) | ORAL | Status: DC | PRN
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
## 2016-01-29 ENCOUNTER — Encounter: Payer: Self-pay | Admitting: Family Medicine

## 2016-01-30 ENCOUNTER — Ambulatory Visit: Payer: BLUE CROSS/BLUE SHIELD | Admitting: Family Medicine

## 2016-02-07 ENCOUNTER — Encounter: Payer: Self-pay | Admitting: Family Medicine

## 2016-02-12 ENCOUNTER — Encounter: Payer: Self-pay | Admitting: Family Medicine

## 2016-02-12 ENCOUNTER — Ambulatory Visit (INDEPENDENT_AMBULATORY_CARE_PROVIDER_SITE_OTHER): Payer: BLUE CROSS/BLUE SHIELD | Admitting: Family Medicine

## 2016-02-12 VITALS — BP 136/94 | HR 85 | Temp 98.4°F | Ht 64.0 in | Wt 175.2 lb

## 2016-02-12 DIAGNOSIS — M542 Cervicalgia: Secondary | ICD-10-CM | POA: Diagnosis not present

## 2016-02-12 NOTE — Progress Notes (Signed)
   Subjective:    Patient ID: Kathy Bradley, female    DOB: 1980/08/26, 35 y.o.   MRN: [id number]  HPI at her last visit we suspended physical therapy because it seemed to be making her symptoms worse. MRI was ordered and finally obtained with some difficulty. I interpret the MRI as not showing any neurologic deficit that would be a contra indication to physical therapy. I did offer her possibility of seeing neurosurgeon.  Patient Active Problem List   Diagnosis Date Noted  . Neck pain 01/24/2016  . 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 carvedilol (COREG) 6.25 MG tablet Take 1 tablet (6.25 mg total) by mouth 2 (two) times daily with a meal.  . cyclobenzaprine (FLEXERIL) 10 MG tablet Take 1 tablet (10 mg total) by mouth 3 (three) times daily as needed for muscle spasms.  Marland Kitchen ibuprofen (ADVIL,MOTRIN) 200 MG tablet Take 200 mg by mouth as needed.  Marland Kitchen levonorgestrel (MIRENA) 20 MCG/24HR IUD 1 each by Intrauterine route once.   No facility-administered encounter medications on file as of 02/12/2016.       Review of Systems  Constitutional: Negative.   Musculoskeletal: Positive for neck pain.  Neurological: Positive for numbness.  Psychiatric/Behavioral: Negative.        Objective:   Physical Exam  Constitutional: She appears well-developed.  Musculoskeletal: Tenderness: left trapezius muscle.  Neurological: She displays abnormal reflex (DTRs are symmetric in the upper extremities).  Psychiatric: She has a normal mood and affect. Her behavior is normal.   BP (!) 136/94 (BP Location: Right Arm, Patient Position: Sitting, Cuff Size: Normal)   Pulse 85   Temp 98.4 F (36.9 C) (Oral)   Ht 5\' 4"  (1.626 m)   Wt 175 lb 3.2 oz (79.5 kg)   BMI 30.07 kg/m         Assessment & Plan:   1. Neck pain Cautiously continue with physical therapy. If  symptoms worsen would typically get her to see neurosurgery. We did go over results of MRI and shows no significant deficit but there are very tiny discs at several levels in her neck  Frederica Kuster MD

## 2016-02-20 ENCOUNTER — Encounter: Payer: Self-pay | Admitting: Family Medicine

## 2016-02-20 DIAGNOSIS — M542 Cervicalgia: Secondary | ICD-10-CM

## 2016-02-28 ENCOUNTER — Ambulatory Visit: Payer: BLUE CROSS/BLUE SHIELD | Attending: Family Medicine | Admitting: Physical Therapy

## 2016-02-28 DIAGNOSIS — M542 Cervicalgia: Secondary | ICD-10-CM | POA: Diagnosis not present

## 2016-02-28 DIAGNOSIS — R293 Abnormal posture: Secondary | ICD-10-CM

## 2016-02-28 NOTE — Therapy (Signed)
[facility] [address] [place], [place], [address] Phone: [phone]   Fax:  [phone]  Physical Therapy Treatment/Recertification  Patient Details  Name: Kathy Bradley MRN: [id number] Date of Birth: [birth year] Referring Provider: Jacalyn Lefevre, MD  Encounter Date: 02/28/2016      PT End of Session - 02/28/16 1149    Visit Number 13   Number of Visits 20   Date for PT Re-Evaluation 03/27/16   PT Start Time 1115   PT Stop Time 1200   PT Time Calculation (min) 45 min   Activity Tolerance Patient tolerated treatment well   Behavior During Therapy [facility] for tasks assessed/performed      Past Medical History:  Diagnosis Date  . Abnormal Pap smear of cervix     Past Surgical History:  Procedure Laterality Date  . CERVICAL CONIZATION W/BX N/A 08/22/2014   Procedure: CONIZATION CERVIX WITH BIOPSY;  Surgeon: Tilda Burrow, MD;  Location: [facility];  Service: Gynecology;  Laterality: N/A;  . COLPOSCOPY VULVA W/ BIOPSY    . DILATION AND CURETTAGE OF UTERUS    . HOLMIUM LASER APPLICATION N/A 08/22/2014   Procedure: HOLMIUM LASER APPLICATION;  Surgeon: Tilda Burrow, MD;  Location: [facility];  Service: Gynecology;  Laterality: N/A;    There were no vitals filed for this visit.      Subjective Assessment - 02/28/16 1122    Subjective feels a lot better; had MRI.  still unable to sleep on L side and sidebending is still difficult.   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 gets dizzy   Patient Stated Goals get back to normal life and back to work   Currently in Pain? Yes   Pain Score 5    Pain Location Neck   Pain Orientation Left   Pain Descriptors / Indicators Aching;Nagging   Pain Type Chronic pain   Pain Radiating Towards L shoulder and numbness/tingling into LUE   Pain Onset More than a month ago   Pain Frequency Constant   Aggravating Factors  unknown   Pain Relieving Factors rest   Effect of Pain on Daily Activities cannot work                         [facility] Adult PT Treatment/Exercise - 02/28/16 1145      Neck Exercises: Seated   Other Seated Exercise scap retraction x 10     Modalities   Modalities Electrical Stimulation;Moist Heat;Ultrasound     Moist Heat Therapy   Number Minutes Moist Heat 15 Minutes   Moist Heat Location Cervical;Shoulder     Electrical Stimulation   Electrical Stimulation Location IFC to L neck and  shoulder blade border x 15 min to tolerance at 80-150 Hz  supine   Electrical Stimulation Goals Pain;Tone     Ultrasound   Ultrasound Location L UT/levator   Ultrasound Parameters 100% DC, 1 mHz freq, 1.2 w/cm2 x 8 min   Ultrasound Goals Pain     Neck Exercises: Stretches   Upper Trapezius Stretch 2 reps;20 seconds   Levator Stretch 2 reps;20 seconds   Corner Stretch 2 reps;20 seconds                PT Education - 02/28/16 1149    Education provided Yes   Education Details HEP  Person(s) Educated Patient   Methods Explanation;Demonstration;Handout   Comprehension Verbalized understanding;Returned demonstration;Need further instruction             PT Long Term Goals - 02/28/16 1149      PT LONG TERM GOAL #1   Title I with HEP   Status Achieved     PT LONG TERM GOAL #2   Title Patient able to move neck without pain.  (03/27/16)   Time 4   Period Weeks   Status On-going     PT LONG TERM GOAL #3   Title Improved cervical ROM to [facility] for [facility]LS (03/27/16)   Time 4   Period Weeks   Status On-going     PT LONG TERM GOAL #4   Title Patient able to sleep without meds. (03/27/16)   Time 4   Period Weeks   Status On-going     PT LONG TERM GOAL #5   Title Patient able to peform ADLs with 2/10 pain or less.  (03/27/16)   Time 4   Period Weeks   Status On-going               Plan - 02/28/16 1150     Clinical Impression Statement Pt continues to have pain affecting ADLs and ability to sleep.  ROM improved but pain with all motions affecting function.  Recommend continued PT 2x/wk x 3-4 weeks and if no improvement in symptoms recommend return to MD or consult to neurosurgeon per MD notes.   Rehab Potential Good   PT Frequency 2x / week   PT Duration 4 weeks   PT Treatment/Interventions ADLs/Self Care Home Management;Electrical Stimulation;Moist Heat;Therapeutic exercise;Ultrasound;Neuromuscular re-education;Patient/family education;Manual techniques;Dry needling;Passive range of motion;Cryotherapy   PT Next Visit Plan Assess DN. Modalities to manage pain, manual/STW to L upper quadrant/neck (focus on SCM and scalenes), Cervical ROM; postural strengthening as tolerated.   Consulted and Agree with Plan of Care Patient      Patient will benefit from skilled therapeutic intervention in order to improve the following deficits and impairments:  Decreased range of motion, Pain, Decreased strength, Postural dysfunction  Visit Diagnosis: Cervicalgia - Plan: PT plan of care cert/re-cert  Abnormal posture - Plan: PT plan of care cert/re-cert     Problem List Patient Active Problem List   Diagnosis Date Noted  . Neck pain 01/24/2016  . 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 [address] [place], [place], [address] Phone: [phone]   Fax:  [phone]  Name: Kathy Bradley MRN: [id number] Date of Birth: Dec 03, 1980

## 2016-02-28 NOTE — Patient Instructions (Signed)
Flexibility: Upper Trapezius Stretch    Gently grasp left side of head while reaching behind back with other hand. Tilt head away until a gentle stretch is felt. Hold __20__ seconds. Repeat __2-3__ times per set. Do ___1_ sets per session. Do _2-3___ sessions per day.  http://orth.exer.us/341   Copyright  VHI. All rights reserved.   Levator Scapula Stretch, Sitting    Sit, one hand tucked under hip on side to be stretched, other hand over top of head. Turn head toward other side and look down. Use hand on head to gently stretch neck in that position. Hold _20-30__ seconds. Repeat 2-3___ times per session. Do _2-3__ sessions per day.  Copyright  VHI. All rights reserved.    CHEST: Doorway, Bilateral - Standing    Standing in doorway, place hands on wall with elbows bent at shoulder height. Lean forward. Hold _20-30__ seconds. _2-3__ reps per set, __2-3_ sets per day.  Copyright  VHI. All rights reserved.   Scapular Retraction (Standing)    With arms at sides, pinch shoulder blades together. Repeat __10__ times per set. Do __1__ sets per session. Do _2-3___ sessions per day.  http://orth.exer.us/945   Copyright  VHI. All rights reserved.

## 2016-03-03 ENCOUNTER — Ambulatory Visit: Payer: BLUE CROSS/BLUE SHIELD | Admitting: Physical Therapy

## 2016-03-03 DIAGNOSIS — R293 Abnormal posture: Secondary | ICD-10-CM

## 2016-03-03 DIAGNOSIS — M542 Cervicalgia: Secondary | ICD-10-CM

## 2016-03-03 NOTE — Therapy (Signed)
[facility] [address] [place], [place], [address] Phone: [phone]   Fax:  [phone]  Physical Therapy Treatment  Patient Details  Name: Kathy FreezeJasmine D Bradley MRN: [id number] Date of Birth: [birth year] Referring Provider: Jacalyn LefevreStephen Miller, MD  Encounter Date: 03/03/2016      PT End of Session - 03/03/16 1611    Visit Number 14   Number of Visits 20   Date for PT Re-Evaluation 03/27/16   PT Start Time 0230   PT Stop Time 0322   PT Time Calculation (min) 52 min      Past Medical History:  Diagnosis Date  . Abnormal Pap smear of cervix     Past Surgical History:  Procedure Laterality Date  . CERVICAL CONIZATION W/BX N/A 08/22/2014   Procedure: CONIZATION CERVIX WITH BIOPSY;  Surgeon: Tilda BurrowJohn Ferguson V, MD;  Location: [facility];  Service: Gynecology;  Laterality: N/A;  . COLPOSCOPY VULVA W/ BIOPSY    . DILATION AND CURETTAGE OF UTERUS    . HOLMIUM LASER APPLICATION N/A 08/22/2014   Procedure: HOLMIUM LASER APPLICATION;  Surgeon: Tilda BurrowJohn Ferguson V, MD;  Location: [facility];  Service: Gynecology;  Laterality: N/A;    There were no vitals filed for this visit.      Subjective Assessment - 03/03/16 1612    Subjective Dr. said to try therapy some more and then I'll be sent to a Neurologist. I feel better today.  I get pain down my left UE to my hand and three fingers go numb.   Pain Score 3    Pain Location Neck   Pain Orientation Left   Pain Descriptors / Indicators Aching   Pain Type Chronic pain                         [facility] Adult PT Treatment/Exercise - 03/03/16 0001      Moist Heat Therapy   Number Minutes Moist Heat --  15   Moist Heat Location Shoulder;Cervical     Electrical Stimulation   Electrical Stimulation Location Pre-mod   Electrical Stimulation Action 80-150 Hz (5 sec on and 5 sec off) x 15 minutes.     Ultrasound   Ultrasound Location L UT   Ultrasound Parameters 1.50 w/CM2 x 12 minutes     Manual  Therapy   Soft tissue mobilization STW/M x 12 minutes to left UT                     PT Long Term Goals - 02/28/16 1149      PT LONG TERM GOAL #1   Title I with HEP   Status Achieved     PT LONG TERM GOAL #2   Title Patient able to move neck without pain.  (03/27/16)   Time 4   Period Weeks   Status On-going     PT LONG TERM GOAL #3   Title Improved cervical ROM to [facility] for ADLS (03/27/16)   Time 4   Period Weeks   Status On-going     PT LONG TERM GOAL #4   Title Patient able to sleep without meds. (03/27/16)   Time 4   Period Weeks   Status On-going     PT LONG TERM GOAL #5   Title Patient able to peform ADLs with 2/10 pain or less.  (03/27/16)   Time 4   Period Weeks   Status On-going  Patient will benefit from skilled therapeutic intervention in order to improve the following deficits and impairments:     Visit Diagnosis: Cervicalgia  Abnormal posture     Problem List Patient Active Problem List   Diagnosis Date Noted  . Neck pain 01/24/2016  . 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

## 2016-03-06 ENCOUNTER — Ambulatory Visit: Payer: BLUE CROSS/BLUE SHIELD | Admitting: Physical Therapy

## 2016-03-06 DIAGNOSIS — R293 Abnormal posture: Secondary | ICD-10-CM

## 2016-03-06 DIAGNOSIS — M542 Cervicalgia: Secondary | ICD-10-CM | POA: Diagnosis not present

## 2016-03-06 NOTE — Therapy (Signed)
[facility] [address] [place], [place], [address] Phone: [phone]   Fax:  [phone]  Physical Therapy Treatment  Patient Details  Name: Kathy FreezeJasmine D Mandt MRN: [id number] Date of Birth: 02-Jan-1981 Referring Provider: Jacalyn LefevreStephen Miller, MD  Encounter Date: 03/06/2016      PT End of Session - 03/06/16 1715    Visit Number 15   Number of Visits 20   Date for PT Re-Evaluation 03/27/16   PT Start Time 1115   PT Stop Time 1208   PT Time Calculation (min) 53 min   Activity Tolerance Patient tolerated treatment well   Behavior During Therapy [facility] for tasks assessed/performed      Past Medical History:  Diagnosis Date  . Abnormal Pap smear of cervix     Past Surgical History:  Procedure Laterality Date  . CERVICAL CONIZATION W/BX N/A 08/22/2014   Procedure: CONIZATION CERVIX WITH BIOPSY;  Surgeon: Tilda BurrowJohn Ferguson V, MD;  Location: [facility];  Service: Gynecology;  Laterality: N/A;  . COLPOSCOPY VULVA W/ BIOPSY    . DILATION AND CURETTAGE OF UTERUS    . HOLMIUM LASER APPLICATION N/A 08/22/2014   Procedure: HOLMIUM LASER APPLICATION;  Surgeon: Tilda BurrowJohn Ferguson V, MD;  Location: [facility];  Service: Gynecology;  Laterality: N/A;    There were no vitals filed for this visit.      Subjective Assessment - 03/06/16 1716    Subjective Stressful day.  I found out I lost my job and had to turn my car back in.   Pain Score 7    Pain Location Neck   Pain Orientation Left   Pain Descriptors / Indicators Aching   Pain Type Acute pain   Pain Onset More than a month ago   Pain Frequency Constant                         [facility] Adult PT Treatment/Exercise - 03/06/16 0001      Electrical Stimulation   Electrical Stimulation Location LT UT   Electrical Stimulation Action Pre-mod   Electrical Stimulation Goals Pain     Ultrasound   Ultrasound Location LT UT   Ultrasound Parameters U/S at 1.50 W/CM2 x 12 minutes.   Ultrasound Goals  Pain     Manual Therapy   Soft tissue mobilization STW/M x 11 minutes.                     PT Long Term Goals - 02/28/16 1149      PT LONG TERM GOAL #1   Title I with HEP   Status Achieved     PT LONG TERM GOAL #2   Title Patient able to move neck without pain.  (03/27/16)   Time 4   Period Weeks   Status On-going     PT LONG TERM GOAL #3   Title Improved cervical ROM to [facility] for ADLS (03/27/16)   Time 4   Period Weeks   Status On-going     PT LONG TERM GOAL #4   Title Patient able to sleep without meds. (03/27/16)   Time 4   Period Weeks   Status On-going     PT LONG TERM GOAL #5   Title Patient able to peform ADLs with 2/10 pain or less.  (03/27/16)   Time 4   Period Weeks   Status On-going               Plan -  03/06/16 1722    Clinical Impression Statement Patient having a high pain-level today rated at 7/10.  CC is pain in left UT region.   Rehab Potential Good   PT Frequency 2x / week   PT Duration 4 weeks   PT Treatment/Interventions ADLs/Self Care Home Management;Electrical Stimulation;Moist Heat;Therapeutic exercise;Ultrasound;Neuromuscular re-education;Patient/family education;Manual techniques;Dry needling;Passive range of motion;Cryotherapy   PT Next Visit Plan Assess DN. Modalities to manage pain, manual/STW to L upper quadrant/neck (focus on SCM and scalenes), Cervical ROM; postural strengthening as tolerated.   PT Home Exercise Plan SCM in shortened position to relax muscle and shoulder shrugs.      Patient will benefit from skilled therapeutic intervention in order to improve the following deficits and impairments:  Decreased range of motion, Pain, Decreased strength, Postural dysfunction  Visit Diagnosis: Cervicalgia  Abnormal posture     Problem List Patient Active Problem List   Diagnosis Date Noted  . Neck pain 01/24/2016  . 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 HSIL:CIN-2/CIN-3/CIS  07/24/2014    Navah Grondin, [place] MPT 03/06/2016, 5:25 PM  [facility] [address] [place], [place], [address] Phone: [phone]   Fax:  [phone]  Name: Kathy FreezeJasmine D Bradley MRN: [id number] Date of Birth: 07-08-1981

## 2016-03-10 ENCOUNTER — Ambulatory Visit: Payer: BLUE CROSS/BLUE SHIELD | Admitting: Physical Therapy

## 2016-03-10 DIAGNOSIS — M542 Cervicalgia: Secondary | ICD-10-CM | POA: Diagnosis not present

## 2016-03-10 DIAGNOSIS — R293 Abnormal posture: Secondary | ICD-10-CM

## 2016-03-10 NOTE — Therapy (Signed)
[facility] [address] [place], [place], [address] Phone: [phone]   Fax:  [phone]  Physical Therapy Treatment  Patient Details  Name: Kathy FreezeJasmine D Craw MRN: [id number] Date of Birth: 1980/08/26 Referring Provider: Jacalyn LefevreStephen Miller, MD  Encounter Date: 03/10/2016      PT End of Session - 03/10/16 0854    Visit Number 16   Number of Visits 20   Date for PT Re-Evaluation 03/27/16   PT Start Time 0815   PT Stop Time 0907   PT Time Calculation (min) 52 min   Activity Tolerance Patient tolerated treatment well   Behavior During Therapy [facility] for tasks assessed/performed      Past Medical History:  Diagnosis Date  . Abnormal Pap smear of cervix     Past Surgical History:  Procedure Laterality Date  . CERVICAL CONIZATION W/BX N/A 08/22/2014   Procedure: CONIZATION CERVIX WITH BIOPSY;  Surgeon: Tilda BurrowJohn Ferguson V, MD;  Location: [facility];  Service: Gynecology;  Laterality: N/A;  . COLPOSCOPY VULVA W/ BIOPSY    . DILATION AND CURETTAGE OF UTERUS    . HOLMIUM LASER APPLICATION N/A 08/22/2014   Procedure: HOLMIUM LASER APPLICATION;  Surgeon: Tilda BurrowJohn Ferguson V, MD;  Location: [facility];  Service: Gynecology;  Laterality: N/A;    There were no vitals filed for this visit.      Subjective Assessment - 03/10/16 0855    Subjective I feel better today my pain is about a 4/10.   Pain Score 4    Pain Location Neck   Pain Orientation Left   Pain Descriptors / Indicators Aching   Pain Type Acute pain   Pain Onset More than a month ago                         [facility]/Ex[facility] - 03/10/16 0001      Moist Heat Therapy   Moist Heat Location --  Left UT/scapular area.     Programme researcher, broadcasting/film/videolectrical Stimulation   Electrical Stimulation Location Left UT/Scapular region   Electrical Stimulation Action IFC at 1.50 W/CM2 x 15 minuttes.     Ultrasound   Ultrasound Location LT UT/Levator scapulae.   Ultrasound Parameters U/S at 1.50  W/cm2 x 12 minutes.     Manual Therapy   Manual Therapy Soft tissue mobilization   Soft tissue mobilization STW/M x 11 minutes to left UT/levator scapulae.                     PT Long Term Goals - 02/28/16 1149      PT LONG TERM GOAL #1   Title I with HEP   Status Achieved     PT LONG TERM GOAL #2   Title Patient able to move neck without pain.  (03/27/16)   Time 4   Period Weeks   Status On-going     PT LONG TERM GOAL #3   Title Improved cervical [facility] to [facility] (03/27/16)   Time 4   Period Weeks   Status On-going     PT LONG TERM GOAL #4   Title Patient able to sleep without meds. (03/27/16)   Time 4   Period Weeks   Status On-going     PT LONG TERM GOAL #5   Title Patient able to peform ADLs with 2/10 pain or less.  (03/27/16)   Time 4   Period Weeks   Status On-going  Patient will benefit from skilled therapeutic intervention in order to improve the following deficits and impairments:  Decreased range of motion, Pain, Decreased strength, Postural dysfunction  Visit Diagnosis: Cervicalgia  Abnormal posture     Problem List Patient Active Problem List   Diagnosis Date Noted  . Neck pain 01/24/2016  . 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

## 2016-03-12 ENCOUNTER — Encounter: Payer: BLUE CROSS/BLUE SHIELD | Admitting: Physical Therapy

## 2016-03-17 ENCOUNTER — Ambulatory Visit: Payer: BLUE CROSS/BLUE SHIELD | Admitting: Physical Therapy

## 2016-03-17 DIAGNOSIS — M542 Cervicalgia: Secondary | ICD-10-CM | POA: Diagnosis not present

## 2016-03-17 DIAGNOSIS — R293 Abnormal posture: Secondary | ICD-10-CM

## 2016-03-17 NOTE — Therapy (Signed)
[facility] [address] [place], [place], [address] Phone: [phone]   Fax:  [phone]  Physical Therapy Treatment  Patient Details  Name: Kathy Bradley MRN: [id number] Date of Birth: Dec 11, 1980 Referring Provider: Jacalyn Lefevre, MD  Encounter Date: 03/17/2016      PT End of Session - 03/17/16 1037    Visit Number 17   Number of Visits 20   Date for PT Re-Evaluation 03/27/16   PT Start Time 0945   PT Stop Time 1037   PT Time Calculation (min) 52 min   Activity Tolerance Patient tolerated treatment well   Behavior During Therapy [facility] for tasks assessed/performed      Past Medical History:  Diagnosis Date  . Abnormal Pap smear of cervix     Past Surgical History:  Procedure Laterality Date  . CERVICAL CONIZATION W/BX N/A 08/22/2014   Procedure: CONIZATION CERVIX WITH BIOPSY;  Surgeon: Tilda Burrow, MD;  Location: [facility];  Service: Gynecology;  Laterality: N/A;  . COLPOSCOPY VULVA W/ BIOPSY    . DILATION AND CURETTAGE OF UTERUS    . HOLMIUM LASER APPLICATION N/A 08/22/2014   Procedure: HOLMIUM LASER APPLICATION;  Surgeon: Tilda Burrow, MD;  Location: [facility];  Service: Gynecology;  Laterality: N/A;    There were no vitals filed for this visit.      Subjective Assessment - 03/17/16 1037    Subjective Pain about a 5/10 today.  Numbness reported into left UE to fingers.  Patient also demonstrated a "muscle" in the area of the anterior triangle of her neck that is present on the non-affected right side but not on the left.   Pain Score 5    Pain Location Neck   Pain Orientation Left   Pain Descriptors / Indicators Aching   Pain Type Acute pain   Pain Frequency Constant                         [facility] Adult PT Treatment/Exercise - 03/17/16 0001      Moist Heat Therapy   Number Minutes Moist Heat 15 Minutes   Moist Heat Location Cervical     Electrical Stimulation   Electrical Stimulation Location  Left UT   Electrical Stimulation Action Pre-mod (5 sec on and 5 sec off) x 15 minutes.     Ultrasound   Ultrasound Location Lt UT   Ultrasound Parameters U/S at 1.50 W/CM2 x 12 minutes.   Ultrasound Goals Pain     Manual Therapy   Manual Therapy Soft tissue mobilization   Soft tissue mobilization STW/M x 12 minutes.                     PT Long Term Goals - 02/28/16 1149      PT LONG TERM GOAL #1   Title I with HEP   Status Achieved     PT LONG TERM GOAL #2   Title Patient able to move neck without pain.  (03/27/16)   Time 4   Period Weeks   Status On-going     PT LONG TERM GOAL #3   Title Improved cervical ROM to [facility] for ADLS (03/27/16)   Time 4   Period Weeks   Status On-going     PT LONG TERM GOAL #4   Title Patient able to sleep without meds. (03/27/16)   Time 4   Period Weeks   Status On-going     PT  LONG TERM GOAL #5   Title Patient able to peform ADLs with 2/10 pain or less.  (03/27/16)   Time 4   Period Weeks   Status On-going               Plan - 03/17/16 1047    PT Next Visit Plan Try intermittment cervical traction beginning at 15#.      Patient will benefit from skilled therapeutic intervention in order to improve the following deficits and impairments:  Decreased range of motion, Pain, Decreased strength, Postural dysfunction  Visit Diagnosis: Cervicalgia  Abnormal posture     Problem List Patient Active Problem List   Diagnosis Date Noted  . Neck pain 01/24/2016  . 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

## 2016-03-20 ENCOUNTER — Ambulatory Visit: Payer: BLUE CROSS/BLUE SHIELD | Admitting: Physical Therapy

## 2016-03-20 DIAGNOSIS — M542 Cervicalgia: Secondary | ICD-10-CM

## 2016-03-20 DIAGNOSIS — R293 Abnormal posture: Secondary | ICD-10-CM

## 2016-03-20 NOTE — Therapy (Signed)
[facility] [address] [place], [place], [address] Phone: [phone]   Fax:  [phone]  Physical Therapy Treatment  Patient Details  Name: Kathy Bradley MRN: [id number] Date of Birth: 05-Jun-1981 Referring Provider: Jacalyn LefevreStephen Miller, MD  Encounter Date: 03/20/2016      PT End of Session - 03/20/16 0939    Visit Number 18   Number of Visits 20   PT Start Time 0900   PT Stop Time 0953   PT Time Calculation (min) 53 min   Activity Tolerance Patient tolerated treatment well   Behavior During Therapy [facility] for tasks assessed/performed      Past Medical History:  Diagnosis Date  . Abnormal Pap smear of cervix     Past Surgical History:  Procedure Laterality Date  . CERVICAL CONIZATION W/BX N/A 08/22/2014   Procedure: CONIZATION CERVIX WITH BIOPSY;  Surgeon: Tilda BurrowJohn Ferguson V, MD;  Location: [facility];  Service: Gynecology;  Laterality: N/A;  . COLPOSCOPY VULVA W/ BIOPSY    . DILATION AND CURETTAGE OF UTERUS    . HOLMIUM LASER APPLICATION N/A 08/22/2014   Procedure: HOLMIUM LASER APPLICATION;  Surgeon: Tilda BurrowJohn Ferguson V, MD;  Location: [facility];  Service: Gynecology;  Laterality: N/A;    There were no vitals filed for this visit.      Subjective Assessment - 03/20/16 1044    Subjective Pain around a 4/10 today.  Still experiencing numbness and tingling in fingers of left hand.     Pain Score 5    Pain Location Neck   Pain Orientation Left   Pain Descriptors / Indicators Aching   Pain Type Acute pain   Pain Onset More than a month ago                         [facility]/Ex[facility] - 03/20/16 0001      Exercises   Exercises Knee/Hip     Modalities   Modalities Traction     Moist Heat Therapy   Number Minutes Moist Heat 15 Minutes   Moist Heat Location Cervical     Electrical Stimulation   Electrical Stimulation Location Left UT.   Electrical Stimulation Action Pre-mod (5 sec on and 5 sec off) x   15     Traction   Type of Traction Cervical   Min (lbs) 5   Max (lbs) 16   Hold Time 99   Rest Time 5   Time 15     Manual Therapy   Manual Therapy Soft tissue mobilization   Soft tissue mobilization STW/M x 8 minutes in supine to left C-spine                     PT Long Term Goals - 02/28/16 1149      PT LONG TERM GOAL #1   Title I with HEP   Status Achieved     PT LONG TERM GOAL #2   Title Patient able to move neck without pain.  (03/27/16)   Time 4   Period Weeks   Status On-going     PT LONG TERM GOAL #3   Title Improved cervical ROM to [facility] (03/27/16)   Time 4   Period Weeks   Status On-going     PT LONG TERM GOAL #4   Title Patient able to sleep without meds. (03/27/16)   Time 4   Period Weeks   Status On-going  PT LONG TERM GOAL #5   Title Patient able to peform ADLs with 2/10 pain or less.  (03/27/16)   Time 4   Period Weeks   Status On-going               Plan - 03/20/16 0941    Clinical Impression Statement The patient demonstrated a midly positive Adson's maneuver on the left.      Patient will benefit from skilled therapeutic intervention in order to improve the following deficits and impairments:  Decreased range of motion, Pain, Decreased strength, Postural dysfunction  Visit Diagnosis: Cervicalgia  Abnormal posture     Problem List Patient Active Problem List   Diagnosis Date Noted  . Neck pain 01/24/2016  . 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

## 2016-03-26 ENCOUNTER — Ambulatory Visit: Payer: BLUE CROSS/BLUE SHIELD | Attending: Family Medicine | Admitting: Physical Therapy

## 2016-03-26 DIAGNOSIS — R293 Abnormal posture: Secondary | ICD-10-CM | POA: Diagnosis present

## 2016-03-26 DIAGNOSIS — M542 Cervicalgia: Secondary | ICD-10-CM | POA: Insufficient documentation
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
## 2016-03-26 NOTE — Patient Instructions (Signed)
Instruct in chin tucks and cervical extension.  Recommended patient perform throughout day.

## 2016-03-31 ENCOUNTER — Ambulatory Visit: Payer: BLUE CROSS/BLUE SHIELD | Admitting: Physical Therapy

## 2016-03-31 DIAGNOSIS — M542 Cervicalgia: Secondary | ICD-10-CM

## 2016-03-31 DIAGNOSIS — R293 Abnormal posture: Secondary | ICD-10-CM

## 2016-03-31 NOTE — Therapy (Addendum)
[facility] [place], [place], [address] Phone: [phone]   Fax:  [phone]  Physical Therapy Treatment  Patient Details  Name: Kathy Bradley MRN: [id number] Date of Birth: [birth year] Referring Provider: Alain Honey, MD  Encounter Date: 03/31/2016      PT End of Session - 03/31/16 0934    Visit Number 20   Number of Visits 20   Date for PT Re-Evaluation 03/27/16   PT Start Time 0900   PT Stop Time 0950   PT Time Calculation (min) 50 min   Activity Tolerance Patient tolerated treatment well   Behavior During Therapy [facility] for tasks assessed/performed      Past Medical History:  Diagnosis Date  . Abnormal Pap smear of cervix     Past Surgical History:  Procedure Laterality Date  . CERVICAL CONIZATION W/BX N/A 08/22/2014   Procedure: CONIZATION CERVIX WITH BIOPSY;  Surgeon: Jonnie Kind, MD;  Location: [facility];  Service: Gynecology;  Laterality: N/A;  . COLPOSCOPY VULVA W/ BIOPSY    . DILATION AND CURETTAGE OF UTERUS    . HOLMIUM LASER APPLICATION N/A 12/27/6293   Procedure: HOLMIUM LASER APPLICATION;  Surgeon: Jonnie Kind, MD;  Location: [facility];  Service: Gynecology;  Laterality: N/A;    There were no vitals filed for this visit.      Subjective Assessment - 03/31/16 0935    Subjective The patient's pain-level has been staying around a 5/10.   Pain Score 5    Pain Location Neck   Pain Orientation Left   Pain Descriptors / Indicators Aching   Pain Type Acute pain   Pain Onset More than a month ago                         [facility] Adult PT Treatment/Exercise - 03/31/16 0001      Moist Heat Therapy   Number Minutes Moist Heat 15 Minutes   Moist Heat Location Cervical     Electrical Stimulation   Electrical Stimulation Location Left C-spine.   Electrical Stimulation Action Pre-mod at 80-150 Hz (5 sec on and 5 sec off)   Electrical Stimulation Goals Pain     Ultrasound   Ultrasound Location Left C-spine.   Ultrasound Parameters U/S at 1.50 W/CM2 x 12 minutes.   Ultrasound Goals Pain     Manual Therapy   Manual Therapy Soft tissue mobilization   Soft tissue mobilization STW/M x 11 minutes.                     PT Long Term Goals - 03/31/16 1026      PT LONG TERM GOAL #1   Title I with HEP   Time 6   Period Weeks   Status Achieved     PT LONG TERM GOAL #2   Title Patient able to move neck without pain.  (03/27/16)   Time 4   Period Weeks   Status Not Met     PT LONG TERM GOAL #3   Title Improved cervical ROM to [facility] for ADLS (08/29/39)   Time 4   Status Not Met     PT LONG TERM GOAL #4   Title Patient able to sleep without meds. (03/27/16)   Time 4   Period Weeks   Status Not Met     PT LONG TERM GOAL #5   Title Patient able to peform ADLs with 2/10 pain or less.  (  03/27/16)   Time 4   Period Weeks   Status Not Met               Plan - 03/31/16 4709    Clinical Impression Statement The patient continues to have pain-levels staying around a 5/10.  Moreover, she continues to experience symtoms into her left UE with c/o numbness and tingling in her left hand and fingers.  The patient has spoke of having a Neuro consult.   PT Next Visit Plan Hold treatments at this time until further medical consultation.      Patient will benefit from skilled therapeutic intervention in order to improve the following deficits and impairments:  Decreased range of motion, Pain, Decreased strength, Postural dysfunction  Visit Diagnosis: Cervicalgia  Abnormal posture     Problem List Patient Active Problem List   Diagnosis Date Noted  . Neck pain 01/24/2016  . 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  Current functional level related to goals / functional outcomes: See above.   Remaining  deficits: Continued neck pain and symptoms into left UE.   Education / Equipment: HEP. Plan: Patient agrees to discharge.  Patient goals were not met. Patient is being discharged due to lack of progress.  ?????      Julena Barbour, [place] MPT 03/31/2016, 10:28 AM  [facility] [address] [place], [place], [address] Phone: [phone]   Fax:  [phone]  Name: Kathy Bradley MRN: [id number] Date of Birth: 02-25-1981

## 2016-04-03 ENCOUNTER — Encounter: Payer: Self-pay | Admitting: Family Medicine

## 2016-04-03 ENCOUNTER — Ambulatory Visit (INDEPENDENT_AMBULATORY_CARE_PROVIDER_SITE_OTHER): Payer: BLUE CROSS/BLUE SHIELD | Admitting: Family Medicine

## 2016-04-03 VITALS — BP 136/91 | HR 91 | Temp 99.3°F | Ht 64.0 in | Wt 179.0 lb

## 2016-04-03 DIAGNOSIS — I10 Essential (primary) hypertension: Secondary | ICD-10-CM | POA: Diagnosis not present

## 2016-04-03 DIAGNOSIS — M542 Cervicalgia: Secondary | ICD-10-CM

## 2016-04-03 MED ORDER — CYCLOBENZAPRINE HCL 10 MG PO TABS: 10.0000 mg | ORAL_TABLET | Freq: Three times a day (TID) | ORAL | 0 refills | Status: DC | PRN

## 2016-04-03 MED ORDER — CARVEDILOL 12.5 MG PO TABS: 6.2500 mg | ORAL_TABLET | Freq: Two times a day (BID) | ORAL | 3 refills | Status: DC
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
## 2016-05-15 ENCOUNTER — Encounter: Payer: Self-pay | Admitting: Family Medicine

## 2016-05-15 ENCOUNTER — Ambulatory Visit (INDEPENDENT_AMBULATORY_CARE_PROVIDER_SITE_OTHER): Payer: BLUE CROSS/BLUE SHIELD | Admitting: Family Medicine

## 2016-05-15 VITALS — BP 137/96 | HR 82 | Temp 97.7°F | Ht 64.0 in | Wt 185.0 lb

## 2016-05-15 DIAGNOSIS — M542 Cervicalgia: Secondary | ICD-10-CM

## 2016-05-15 DIAGNOSIS — I10 Essential (primary) hypertension: Secondary | ICD-10-CM | POA: Diagnosis not present

## 2016-05-15 MED ORDER — GABAPENTIN 300 MG PO CAPS: 300.0000 mg | ORAL_CAPSULE | Freq: Two times a day (BID) | ORAL | 1 refills | Status: DC

## 2016-05-15 NOTE — Progress Notes (Signed)
   Subjective:    Patient ID: Kathy Bradley, female    DOB: 28-Jan-1981, 35 y.o.   MRN: [id number]  HPI Patient here today for a follow up. She has recently seen her neurosurgery and wants to discuss that today. Neurosurgery basically told her he could find no reason for her symptoms. EMG was done as well as review of her MRI. She has now finished physical therapy and continues to have some weakness in her left upper extremity as well as pain.     Patient Active Problem List   Diagnosis Date Noted  . Essential hypertension 04/03/2016  . Neck pain 01/24/2016  . 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 carvedilol (COREG) 12.5 MG tablet Take 0.5 tablets (6.25 mg total) by mouth 2 (two) times daily with a meal.  . cyclobenzaprine (FLEXERIL) 10 MG tablet Take 1 tablet (10 mg total) by mouth 3 (three) times daily as needed for muscle spasms.  Marland Kitchen. ibuprofen (ADVIL,MOTRIN) 200 MG tablet Take 200 mg by mouth as needed.  Marland Kitchen. levonorgestrel (MIRENA) 20 MCG/24HR IUD 1 each by Intrauterine route once.   No facility-administered encounter medications on file as of 05/15/2016.       Review of Systems  Constitutional: Negative.   HENT: Negative.   Eyes: Negative.   Respiratory: Negative.   Cardiovascular: Negative.   Gastrointestinal: Negative.   Endocrine: Negative.   Genitourinary: Negative.   Musculoskeletal: Negative.   Skin: Negative.   Allergic/Immunologic: Negative.   Neurological: Negative.   Hematological: Negative.   Psychiatric/Behavioral: Negative.        Objective:   Physical Exam  Constitutional: She appears well-developed and well-nourished.  Neck: Normal range of motion.  Neurological:  Reflexes are normal in the upper extremities. There is some decrease in strength involving her left upper extremity compared to the right but it  should be noted that she is right-handed.  Psychiatric: She has a normal mood and affect. Her behavior is normal.    BP (!) 137/96 (BP Location: Left Arm)   Pulse 82   Temp 97.7 F (36.5 C) (Oral)   Ht 5\' 4"  (1.626 m)   Wt 185 lb (83.9 kg)   BMI 31.76 kg/m        Assessment & Plan:   1. Neck pain Pain is really more in her arm and hand and neck. Will try gabapentin. Encourage exercises given by physical therapy.   2. Essential hypertension Blood pressure is still not optimally controlled. Will recheck again in one month. She should be taking carvedilol 12.5 mg twice a day at this time. May need another class of blood pressure medicine to control.  Frederica KusterStephen M Asta Corbridge MD

## 2016-06-17 ENCOUNTER — Ambulatory Visit (INDEPENDENT_AMBULATORY_CARE_PROVIDER_SITE_OTHER): Payer: BLUE CROSS/BLUE SHIELD | Admitting: Family Medicine

## 2016-06-17 ENCOUNTER — Encounter: Payer: Self-pay | Admitting: Family Medicine

## 2016-06-17 VITALS — BP 132/91 | HR 89 | Temp 98.0°F | Ht 64.0 in | Wt 188.0 lb

## 2016-06-17 DIAGNOSIS — Z23 Encounter for immunization: Secondary | ICD-10-CM | POA: Diagnosis not present

## 2016-06-17 DIAGNOSIS — I10 Essential (primary) hypertension: Secondary | ICD-10-CM

## 2016-06-17 DIAGNOSIS — M542 Cervicalgia: Secondary | ICD-10-CM | POA: Diagnosis not present

## 2016-06-17 MED ORDER — HYDROCHLOROTHIAZIDE 12.5 MG PO CAPS: 12.5000 mg | ORAL_CAPSULE | Freq: Every day | ORAL | 2 refills | Status: DC

## 2016-06-17 MED ORDER — CARVEDILOL 12.5 MG PO TABS: 12.5000 mg | ORAL_TABLET | Freq: Two times a day (BID) | ORAL | 2 refills | Status: DC

## 2016-06-17 NOTE — Progress Notes (Signed)
   Subjective:    Patient ID: Kathy FreezeJasmine D Bradley, female    DOB: January 26, 1981, 35 y.o.   MRN: [id number]  HPI follow-up visit for this 35 year old female with hypertension and chronic back and left upper extremity pain and paresthesias. Blood pressure has improved since going to carvedilol 12.5 mg twice a day, but it is still not at goal. Gabapentin was started at last visit and it has helped the paresthesias in her left arm. She seems pleased that this has made a difference.  Patient Active Problem List   Diagnosis Date Noted  . Essential hypertension 04/03/2016  . Neck pain 01/24/2016  . 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 carvedilol (COREG) 12.5 MG tablet Take 0.5 tablets (6.25 mg total) by mouth 2 (two) times daily with a meal. (Patient taking differently: Take 12.5 mg by mouth 2 (two) times daily with a meal. )  . cyclobenzaprine (FLEXERIL) 10 MG tablet Take 1 tablet (10 mg total) by mouth 3 (three) times daily as needed for muscle spasms.  Marland Kitchen. gabapentin (NEURONTIN) 300 MG capsule Take 1 capsule (300 mg total) by mouth 2 (two) times daily. As directed  . ibuprofen (ADVIL,MOTRIN) 200 MG tablet Take 200 mg by mouth as needed.  Marland Kitchen. levonorgestrel (MIRENA) 20 MCG/24HR IUD 1 each by Intrauterine route once.   No facility-administered encounter medications on file as of 06/17/2016.       Review of Systems  Constitutional: Negative.   HENT: Negative.   Respiratory: Negative.   Cardiovascular: Negative.   Neurological: Negative.   Psychiatric/Behavioral: Negative.        Objective:   Physical Exam  Constitutional: She is oriented to person, place, and time. She appears well-developed and well-nourished.  Cardiovascular: Normal rate, regular rhythm and normal heart sounds.   Pulmonary/Chest: Effort normal.  Neurological: She is alert and oriented  to person, place, and time.  Psychiatric: She has a normal mood and affect. Her behavior is normal.   BP (!) 132/91   Pulse 89   Temp 98 F (36.7 C) (Oral)   Ht 5\' 4"  (1.626 m)   Wt 188 lb (85.3 kg)   BMI 32.27 kg/m         Assessment & Plan:  1. Essential hypertension Add HCTZ to her carvedilol. Recheck in 2 months  2. Neck pain Continue with gabapentin but may increase it either in frequency to 3 times per day or increase a.m. Dose  Frederica KusterStephen M Miller MD

## 2016-06-30 ENCOUNTER — Other Ambulatory Visit: Payer: Self-pay | Admitting: Family Medicine

## 2016-07-02 ENCOUNTER — Encounter: Payer: Self-pay | Admitting: Family Medicine

## 2016-07-03 MED ORDER — GABAPENTIN 300 MG PO CAPS: 300.0000 mg | ORAL_CAPSULE | Freq: Four times a day (QID) | ORAL | 1 refills | Status: DC

## 2016-07-19 ENCOUNTER — Encounter: Payer: Self-pay | Admitting: Family Medicine

## 2016-07-21 HISTORY — PX: LUMBAR LAMINECTOMY: SHX95

## 2016-07-29 ENCOUNTER — Ambulatory Visit (INDEPENDENT_AMBULATORY_CARE_PROVIDER_SITE_OTHER): Payer: BLUE CROSS/BLUE SHIELD | Admitting: Family Medicine

## 2016-07-29 ENCOUNTER — Encounter: Payer: Self-pay | Admitting: Family Medicine

## 2016-07-29 ENCOUNTER — Ambulatory Visit (INDEPENDENT_AMBULATORY_CARE_PROVIDER_SITE_OTHER): Payer: BLUE CROSS/BLUE SHIELD

## 2016-07-29 VITALS — BP 132/91 | HR 96 | Temp 98.6°F | Ht 64.0 in | Wt 191.0 lb

## 2016-07-29 DIAGNOSIS — M545 Low back pain, unspecified: Secondary | ICD-10-CM

## 2016-07-29 MED ORDER — HYDROCODONE-ACETAMINOPHEN 5-300 MG PO TABS: 1.0000 | ORAL_TABLET | Freq: Every evening | ORAL | 0 refills | Status: DC | PRN

## 2016-07-29 MED ORDER — LIDOCAINE 5 % EX PTCH: 1.0000 | MEDICATED_PATCH | CUTANEOUS | 0 refills | Status: DC
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
## 2016-07-29 NOTE — Patient Instructions (Signed)
Low back exer Back Pain, Adult Introduction Back pain is very common. The pain often gets better over time. The cause of back pain is usually not dangerous. Most people can learn to manage their back pain on their own. Follow these instructions at home: Watch your back pain for any changes. The following actions may help to lessen any pain you are feeling:  Stay active. Start with short walks on flat ground if you can. Try to walk farther each day.  Exercise regularly as told by your doctor. Exercise helps your back heal faster. It also helps avoid future injury by keeping your muscles strong and flexible.  Do not sit, drive, or stand in one place for more than 30 minutes.  Do not stay in bed. Resting more than 1-2 days can slow down your recovery.  Be careful when you bend or lift an object. Use good form when lifting:  Bend at your knees.  Keep the object close to your body.  Do not twist.  Sleep on a firm mattress. Lie on your side, and bend your knees. If you lie on your back, put a pillow under your knees.  Take medicines only as told by your doctor.  Put ice on the injured area.  Put ice in a plastic bag.  Place a towel between your skin and the bag.  Leave the ice on for 20 minutes, 2-3 times a day for the first 2-3 days. After that, you can switch between ice and heat packs.  Avoid feeling anxious or stressed. Find good ways to deal with stress, such as exercise.  Maintain a healthy weight. Extra weight puts stress on your back. Contact a doctor if:  You have pain that does not go away with rest or medicine.  You have worsening pain that goes down into your legs or buttocks.  You have pain that does not get better in one week.  You have pain at night.  You lose weight.  You have a fever or chills. Get help right away if:  You cannot control when you poop (bowel movement) or pee (urinate).  Your arms or legs feel weak.  Your arms or legs lose feeling  (numbness).  You feel sick to your stomach (nauseous) or throw up (vomit).  You have belly (abdominal) pain.  You feel like you may pass out (faint). This information is not intended to replace advice given to you by your health care provider. Make sure you discuss any questions you have with your health care provider. Document Released: 12/24/2007 Document Revised: 12/13/2015 Document Reviewed: 11/08/2013  2017 Elsevier

## 2016-08-05 ENCOUNTER — Telehealth: Payer: Self-pay

## 2016-08-11 NOTE — Telephone Encounter (Signed)
x

## 2016-08-21 ENCOUNTER — Encounter: Payer: Self-pay | Admitting: Family Medicine

## 2016-08-21 ENCOUNTER — Ambulatory Visit (INDEPENDENT_AMBULATORY_CARE_PROVIDER_SITE_OTHER): Payer: BLUE CROSS/BLUE SHIELD | Admitting: Family Medicine

## 2016-08-21 VITALS — BP 129/92 | HR 79 | Temp 97.4°F | Ht 64.0 in | Wt 193.0 lb

## 2016-08-21 DIAGNOSIS — M545 Low back pain, unspecified: Secondary | ICD-10-CM

## 2016-08-21 MED ORDER — HYDROCODONE-ACETAMINOPHEN 5-300 MG PO TABS: 1.0000 | ORAL_TABLET | Freq: Every evening | ORAL | 0 refills | Status: DC | PRN

## 2016-08-21 NOTE — Progress Notes (Signed)
   Subjective:    Patient ID: Kathy Bradley, female    DOB: [birth year], 36 y.o.   MRN: [id number]  HPI 36 year old female with persistent left lower back pain with radiation down her leg. Symptoms have been present now since last week in November. I saw her initially January 9 for the symptoms. She has tried conservative treatment and terms of flexion exercises and analgesics without relief.  Patient Active Problem List   Diagnosis Date Noted  . Essential hypertension 04/03/2016  . Neck pain 01/24/2016  . 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 carvedilol (COREG) 12.5 MG tablet Take 1 tablet (12.5 mg total) by mouth 2 (two) times daily with a meal.  . gabapentin (NEURONTIN) 300 MG capsule Take 1 capsule (300 mg total) by mouth 4 (four) times daily.  . hydrochlorothiazide (MICROZIDE) 12.5 MG capsule Take 1 capsule (12.5 mg total) by mouth daily.  Marland Kitchen. ibuprofen (ADVIL,MOTRIN) 200 MG tablet Take 200 mg by mouth as needed.  Marland Kitchen. levonorgestrel (MIRENA) 20 MCG/24HR IUD 1 each by Intrauterine route once.  . cyclobenzaprine (FLEXERIL) 10 MG tablet Take 1 tablet (10 mg total) by mouth 3 (three) times daily as needed for muscle spasms. (Patient not taking: Reported on 08/21/2016)  . Hydrocodone-Acetaminophen 5-300 MG TABS Take 1 tablet by mouth at bedtime as needed. (Patient not taking: Reported on 08/21/2016)  . lidocaine (LIDODERM) 5 % Place 1 patch onto the skin daily. Remove & Discard patch within 12 hours or as directed by MD (Patient not taking: Reported on 08/21/2016)   No facility-administered encounter medications on file as of 08/21/2016.       Review of Systems  Constitutional: Negative.   Respiratory: Negative.   Cardiovascular: Negative.   Musculoskeletal: Positive for back pain.       Objective:   Physical Exam  Constitutional: She is oriented to  person, place, and time. She appears well-developed and well-nourished.  Musculoskeletal:  Back: Some tenderness to palpation left lumbar region. Straight leg raising is negative. Deep tendon reflexes are symmetric.  Neurological: She is alert and oriented to person, place, and time. Coordination normal.   BP (!) 129/92   Pulse 79   Temp 97.4 F (36.3 C) (Oral)   Ht 5\' 4"  (1.626 m)   Wt 193 lb (87.5 kg)   BMI 33.13 kg/m         Assessment & Plan:  1. Left-sided low back pain without sciatica, unspecified chronicity Since symptoms of been present now for 8-9 weeks and she is not improving, I feel like MRI is the next step to evaluate - MR Lumbar Spine Wo Contrast; Future  Frederica KusterStephen M Miller MD

## 2016-08-24 ENCOUNTER — Other Ambulatory Visit: Payer: Self-pay | Admitting: Family Medicine

## 2016-08-27 ENCOUNTER — Encounter (HOSPITAL_COMMUNITY): Payer: Self-pay | Admitting: Emergency Medicine

## 2016-08-27 ENCOUNTER — Ambulatory Visit (HOSPITAL_COMMUNITY)
Admission: RE | Admit: 2016-08-27 | Discharge: 2016-08-27 | Disposition: A | Discharge status: Home / Self Care | Payer: BLUE CROSS/BLUE SHIELD | Source: Ambulatory Visit | Attending: Family Medicine | Admitting: Family Medicine

## 2016-08-27 ENCOUNTER — Emergency Department (HOSPITAL_COMMUNITY)
Admission: EM | Admit: 2016-08-27 | Discharge: 2016-08-27 | Disposition: A | Discharge status: Home / Self Care | Payer: BLUE CROSS/BLUE SHIELD | Attending: Emergency Medicine | Admitting: Emergency Medicine

## 2016-08-27 DIAGNOSIS — K029 Dental caries, unspecified: Secondary | ICD-10-CM | POA: Insufficient documentation

## 2016-08-27 DIAGNOSIS — M545 Low back pain, unspecified: Secondary | ICD-10-CM

## 2016-08-27 DIAGNOSIS — M48061 Spinal stenosis, lumbar region without neurogenic claudication: Secondary | ICD-10-CM

## 2016-08-27 DIAGNOSIS — F17210 Nicotine dependence, cigarettes, uncomplicated: Secondary | ICD-10-CM | POA: Diagnosis not present

## 2016-08-27 DIAGNOSIS — I10 Essential (primary) hypertension: Secondary | ICD-10-CM | POA: Insufficient documentation

## 2016-08-27 DIAGNOSIS — K0889 Other specified disorders of teeth and supporting structures: Secondary | ICD-10-CM | POA: Diagnosis present

## 2016-08-27 DIAGNOSIS — Z79899 Other long term (current) drug therapy: Secondary | ICD-10-CM | POA: Diagnosis not present

## 2016-08-27 MED ORDER — CLINDAMYCIN HCL 150 MG PO CAPS
300.0000 mg | ORAL_CAPSULE | Freq: Once | ORAL | Status: AC
  Administered 2016-08-27: 300 mg via ORAL
  Filled 2016-08-27: qty 2

## 2016-08-27 MED ORDER — CLINDAMYCIN HCL 150 MG PO CAPS: 300.0000 mg | ORAL_CAPSULE | Freq: Four times a day (QID) | ORAL | 0 refills | Status: DC

## 2016-08-27 NOTE — ED Triage Notes (Addendum)
Patient complaining of left lower dental pain x 3 days. States she has an abscessed tooth in that area. Patient's B/P elevated at triage. Patient states "I don't think I took my blood pressure medicine today."

## 2016-08-27 NOTE — ED Provider Notes (Signed)
[facility]-EM[facility] Provider Note   CSN: [id number] Arrival date & time: 08/27/16  1931     History   Chief Complaint Chief Complaint  Patient presents with  . Dental Pain    HPI Kathy Bradley is a 36 y.o. female.  HPI   Kathy FreezeJasmine D Bradley is a 36 y.o. female who presents to the Emergency Department complaining of dental pain for 3 days.  She reports having pain to the left lower back tooth.  Pain radiates to left face and ear.  Pain worse with chewing.   She is taking ibuprofen and hydrocodone with some relief.  She denies neck pain, facial swelling, difficulty opening or closing her mouth and fever.    Past Medical History:  Diagnosis Date  . Abnormal Pap smear of cervix     Patient Active Problem List   Diagnosis Date Noted  . Essential hypertension 04/03/2016  . Neck pain 01/24/2016  . 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 CERVICAL CONIZATION W/BX N/A 08/22/2014   Procedure: CONIZATION CERVIX WITH BIOPSY;  Surgeon: Tilda BurrowJohn Ferguson V, MD;  Location: [facility];  Service: Gynecology;  Laterality: N/A;  . COLPOSCOPY VULVA W/ BIOPSY    . DILATION AND CURETTAGE OF UTERUS    . HOLMIUM LASER APPLICATION N/A 08/22/2014   Procedure: HOLMIUM LASER APPLICATION;  Surgeon: Tilda BurrowJohn Ferguson V, MD;  Location: [facility];  Service: Gynecology;  Laterality: N/A;    OB History    Gravida Para Term Preterm AB Living   2 1 1   1 1    SAB TAB Ectopic Multiple Live Births   1               Home Medications    Prior to Admission medications   Medication Sig Start Date End Date Taking? Authorizing Provider  carvedilol (COREG) 12.5 MG tablet Take 1 tablet (12.5 mg total) by mouth 2 (two) times daily with a meal. 06/17/16   Frederica KusterStephen M Miller, MD  cyclobenzaprine (FLEXERIL) 10 MG tablet Take 1 tablet (10 mg total) by mouth 3 (three) times daily as needed for muscle  spasms. Patient not taking: Reported on 08/21/2016 04/03/16   Frederica KusterStephen M Miller, MD  gabapentin (NEURONTIN) 300 MG capsule TAKE 1 CAPSULE FOUR TIMES A DAY 08/25/16   Frederica KusterStephen M Miller, MD  hydrochlorothiazide (MICROZIDE) 12.5 MG capsule TAKE 1 CAPSULE (12.5 MG TOTAL) BY MOUTH DAILY. 08/25/16   Frederica KusterStephen M Miller, MD  Hydrocodone-Acetaminophen 5-300 MG TABS Take 1 tablet by mouth at bedtime as needed. 08/21/16   Frederica KusterStephen M Miller, MD  ibuprofen (ADVIL,MOTRIN) 200 MG tablet Take 200 mg by mouth as needed.    Historical Provider, MD  levonorgestrel (MIRENA) 20 MCG/24HR IUD 1 each by Intrauterine route once.    Historical Provider, MD  lidocaine (LIDODERM) 5 % Place 1 patch onto the skin daily. Remove & Discard patch within 12 hours or as directed by MD Patient not taking: Reported on 08/21/2016 07/29/16   Frederica KusterStephen M Miller, MD    Family History Family History  Problem Relation Age of Onset  . Hypertension Mother   . Hypertension Father   . Hypertension Maternal Grandmother   . Hypertension Maternal Grandfather   . Cancer Paternal Aunt     cervical  . Cancer Paternal Grandmother     ovarian    Social History Social History  Substance Use  Topics  . Smoking status: Current Every Day Smoker    Packs/day: 1.00    Years: 10.00    Types: Cigarettes    Last attempt to quit: 11/18/2013  . Smokeless tobacco: Never Used  . Alcohol use Yes     Comment: occ.     Allergies   Penicillins   Review of Systems Review of Systems  Constitutional: Negative for appetite change and fever.  HENT: Positive for dental problem. Negative for congestion, facial swelling, sore throat and trouble swallowing.   Eyes: Negative for pain and visual disturbance.  Musculoskeletal: Negative for neck pain and neck stiffness.  Neurological: Negative for dizziness, facial asymmetry and headaches.  Hematological: Negative for adenopathy.  All other systems reviewed and are negative.    Physical Exam Updated Vital Signs BP (!)  165/106 (BP Location: Right Arm)   Pulse 91   Temp 98.2 F (36.8 C) (Oral)   Resp 18   Ht 5\' 5"  (1.651 m)   Wt 87.5 kg   SpO2 98%   BMI 32.12 kg/m   Physical Exam  Constitutional: She is oriented to person, place, and time. She appears well-developed and well-nourished. No distress.  HENT:  Head: Normocephalic and atraumatic.  Right Ear: Tympanic membrane and ear canal normal.  Left Ear: Tympanic membrane and ear canal normal.  Mouth/Throat: Uvula is midline, oropharynx is clear and moist and mucous membranes are normal. No trismus in the jaw. Dental caries present. No dental abscesses or uvula swelling.  Tenderness and dental caries of the left lower third molar.  No facial swelling, obvious dental abscess, trismus, or sublingual abnml.  Widespread dental decay  Neck: Normal range of motion. Neck supple.  Cardiovascular: Normal rate, regular rhythm and normal heart sounds.   No murmur heard. Pulmonary/Chest: Effort normal and breath sounds normal.  Musculoskeletal: Normal range of motion.  Lymphadenopathy:    She has no cervical adenopathy.  Neurological: She is alert and oriented to person, place, and time. She exhibits normal muscle tone. Coordination normal.  Skin: Skin is warm and dry.  Nursing note and vitals reviewed.    ED Treatments / Results  Labs (all labs ordered are listed, but only abnormal results are displayed) Labs Reviewed - No data to display  EKG  EKG Interpretation None       Radiology   Procedures Procedures (including critical care time)  Medications Ordered in ED Medications  clindamycin (CLEOCIN) capsule 300 mg (not administered)     Initial Impression / Assessment and Plan / ED Course  I have reviewed the triage vital signs and the nursing notes.  Pertinent labs & imaging results that were available during my care of the patient were reviewed by me and considered in my medical decision making (see chart for details).     Pt well  appearing.  Airway patent.  No concerning sx's for dental abscess or Ludwig's angina.  Referral info given for local dentistry.    Topical anesthetic (hurricaine spray) applied for symptomatic relief.  Rx for clinda.  Has hydrocodone at home.    Final Clinical Impressions(s) / ED Diagnoses   Final diagnoses:  Pain, dental    New Prescriptions New Prescriptions   No medications on file     Rosey Bath 08/27/16 2030    Marily Memos, MD 08/27/16 2221

## 2016-08-27 NOTE — Discharge Instructions (Signed)
Follow-up with a dentist soon.   °

## 2016-08-30 ENCOUNTER — Other Ambulatory Visit: Payer: Self-pay | Admitting: Family Medicine

## 2016-08-31 ENCOUNTER — Encounter: Payer: Self-pay | Admitting: Family Medicine

## 2016-09-01 ENCOUNTER — Other Ambulatory Visit: Payer: Self-pay | Admitting: Family Medicine

## 2016-09-01 NOTE — Telephone Encounter (Signed)
Tell patient that I did not see anything urgent or severe. However there were some findings that need to be correlated with your overall condition. Dr. Hyacinth MeekerMiller is best suited to do that. You should hear from him upon his return later this week.

## 2016-09-09 ENCOUNTER — Encounter: Payer: Self-pay | Admitting: Family Medicine

## 2016-09-09 ENCOUNTER — Ambulatory Visit (INDEPENDENT_AMBULATORY_CARE_PROVIDER_SITE_OTHER): Payer: BLUE CROSS/BLUE SHIELD | Admitting: Family Medicine

## 2016-09-09 VITALS — BP 128/91 | HR 86 | Temp 99.4°F | Ht 65.0 in | Wt 192.0 lb

## 2016-09-09 DIAGNOSIS — M48061 Spinal stenosis, lumbar region without neurogenic claudication: Secondary | ICD-10-CM

## 2016-09-09 DIAGNOSIS — M9983 Other biomechanical lesions of lumbar region: Secondary | ICD-10-CM

## 2016-09-09 DIAGNOSIS — R937 Abnormal findings on diagnostic imaging of other parts of musculoskeletal system: Secondary | ICD-10-CM

## 2016-09-09 MED ORDER — GABAPENTIN 300 MG PO CAPS: ORAL_CAPSULE | ORAL | 3 refills | Status: DC

## 2016-09-09 MED ORDER — CARVEDILOL 25 MG PO TABS: 25.0000 mg | ORAL_TABLET | Freq: Two times a day (BID) | ORAL | 3 refills | Status: DC

## 2016-09-09 MED ORDER — HYDROCODONE-ACETAMINOPHEN 5-300 MG PO TABS: 1.0000 | ORAL_TABLET | Freq: Every evening | ORAL | 0 refills | Status: DC | PRN
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
## 2016-09-18 NOTE — Telephone Encounter (Signed)
Pt was seen in office 09/09/16 with Dr. Hyacinth MeekerMiller. Will close encounter.

## 2016-10-07 ENCOUNTER — Ambulatory Visit: Payer: BLUE CROSS/BLUE SHIELD | Attending: Student | Admitting: Physical Therapy

## 2016-10-07 DIAGNOSIS — M542 Cervicalgia: Secondary | ICD-10-CM | POA: Diagnosis present

## 2016-10-07 DIAGNOSIS — R293 Abnormal posture: Secondary | ICD-10-CM | POA: Insufficient documentation

## 2016-10-07 DIAGNOSIS — M6281 Muscle weakness (generalized): Secondary | ICD-10-CM | POA: Insufficient documentation

## 2016-10-07 DIAGNOSIS — G8929 Other chronic pain: Secondary | ICD-10-CM

## 2016-10-07 DIAGNOSIS — M545 Low back pain: Secondary | ICD-10-CM | POA: Diagnosis not present
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
## 2016-10-09 ENCOUNTER — Ambulatory Visit: Payer: BLUE CROSS/BLUE SHIELD | Admitting: Physical Therapy

## 2016-10-09 ENCOUNTER — Encounter: Payer: Self-pay | Admitting: Physical Therapy

## 2016-10-09 DIAGNOSIS — G8929 Other chronic pain: Secondary | ICD-10-CM

## 2016-10-09 DIAGNOSIS — M6281 Muscle weakness (generalized): Secondary | ICD-10-CM

## 2016-10-09 DIAGNOSIS — M545 Low back pain: Principal | ICD-10-CM

## 2016-10-09 DIAGNOSIS — M542 Cervicalgia: Secondary | ICD-10-CM

## 2016-10-09 DIAGNOSIS — R293 Abnormal posture: Secondary | ICD-10-CM
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
## 2016-10-14 ENCOUNTER — Ambulatory Visit: Payer: BLUE CROSS/BLUE SHIELD | Admitting: Physical Therapy

## 2016-10-14 DIAGNOSIS — G8929 Other chronic pain: Secondary | ICD-10-CM

## 2016-10-14 DIAGNOSIS — M545 Low back pain: Principal | ICD-10-CM

## 2016-10-14 DIAGNOSIS — M6281 Muscle weakness (generalized): Secondary | ICD-10-CM

## 2016-10-14 NOTE — Therapy (Signed)
[facility] [address] [place], [place], [address] Phone: [phone]   Fax:  [phone]  Physical Therapy Treatment  Patient Details  Name: Kathy Bradley MRN: [id number] Date of Birth: 07-19-1981 Referring Provider: Verlin Dike, NP  Encounter Date: 10/14/2016    Past Medical History:  Diagnosis Date  . Abnormal Pap smear of cervix     Past Surgical History:  Procedure Laterality Date  . CERVICAL CONIZATION W/BX N/A 08/22/2014   Procedure: CONIZATION CERVIX WITH BIOPSY;  Surgeon: Tilda Burrow, MD;  Location: [facility];  Service: Gynecology;  Laterality: N/A;  . COLPOSCOPY VULVA W/ BIOPSY    . DILATION AND CURETTAGE OF UTERUS    . HOLMIUM LASER APPLICATION N/A 08/22/2014   Procedure: HOLMIUM LASER APPLICATION;  Surgeon: Tilda Burrow, MD;  Location: [facility];  Service: Gynecology;  Laterality: N/A;    There were no vitals filed for this visit.      Subjective Assessment - 10/14/16 0943    Subjective I tripped on something and "fell down my hallway."  The left side of my back is hurting today.   Pain Score 7    Pain Location Back   Pain Orientation Left;Lower;Mid   Pain Descriptors / Indicators Aching;Throbbing;Pins and needles   Pain Type Chronic pain   Pain Onset More than a month ago                         [facility] Adult PT Treatment/Exercise - 10/14/16 0001      Modalities   Modalities Electrical Stimulation;Moist Heat;Ultrasound     Moist Heat Therapy   Number Minutes Moist Heat 15 Minutes   Moist Heat Location Lumbar Spine     Electrical Stimulation   Electrical Stimulation Location Left low back.   Electrical Stimulation Action Pre-mod.   Electrical Stimulation Parameters Constant 80-150 Hz x 15 minutes.   Electrical Stimulation Goals Pain     Ultrasound   Ultrasound Location Left low back.   Ultrasound Parameters 1.50 W/CM2 x 12 minutes.   Ultrasound Goals Pain     Manual Therapy   Manual Therapy Soft tissue mobilization   Soft tissue mobilization Right sdly position with folded pillow between knees for comfort:  STW/M including QL release technique on left x 12 minutes.                     PT Long Term Goals - 10/09/16 0955      PT LONG TERM GOAL #1   Title I with HEP   Time 6   Period Weeks     PT LONG TERM GOAL #2   Title Walk a community distance wiht pain not > 3/10.   Time 6   Period Weeks   Status New     PT LONG TERM GOAL #3   Title Sit 30 minutes with pain not > 3/10.   Period Weeks   Status New     PT LONG TERM GOAL #4   Title Eliminate left LE symptoms.   Time 6   Period Weeks   Status New             Patient will benefit from skilled therapeutic intervention in order to improve the following deficits and impairments:     Visit Diagnosis: Chronic midline low back pain, with sciatica presence unspecified  Muscle weakness (generalized)     Problem List Patient Active Problem List   Diagnosis  Date Noted  . Essential hypertension 04/03/2016  . Neck pain 01/24/2016  . 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

## 2016-10-21 ENCOUNTER — Ambulatory Visit: Payer: BLUE CROSS/BLUE SHIELD | Attending: Student | Admitting: *Deleted

## 2016-10-21 DIAGNOSIS — G8929 Other chronic pain: Secondary | ICD-10-CM | POA: Diagnosis present

## 2016-10-21 DIAGNOSIS — M545 Low back pain: Secondary | ICD-10-CM | POA: Insufficient documentation

## 2016-10-21 DIAGNOSIS — M6281 Muscle weakness (generalized): Secondary | ICD-10-CM | POA: Insufficient documentation
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
## 2016-10-21 NOTE — Patient Instructions (Signed)
Isometric Abdominal   Lying on back with knees bent, tighten stomach by pulling navel down. Hold ____ seconds.           TRY IN SITTING AND STANDING ALSO Repeat __5__ times per set. Do __5__ sets per session. Do _3-5___ sessions per day.  http://orth.exer.us/1086   Copyright  VHI. All rights reserved.  Bent Leg Lift (Hook-Lying)   Tighten stomach and slowly raise right leg _6___ inches from floor. Keep trunk rigid. Hold _2-3___ seconds. Repeat _10___ times per set. Do _3___ sets per session. Do _2-3___ sessions per day.  http://orth.exer.us/1090   Copyright  VHI. All rights reserved.  Bridging   Slowly raise buttocks from floor, keeping stomach tight. Repeat __10__ times per set. Do _3___ sets per session. Do __2-3__ sessions per day.  http://orth.exer.us/1096   Copyright  VHI. All rights reserved.

## 2016-10-23 ENCOUNTER — Ambulatory Visit: Payer: BLUE CROSS/BLUE SHIELD | Admitting: *Deleted

## 2016-10-23 DIAGNOSIS — M545 Low back pain: Principal | ICD-10-CM

## 2016-10-23 DIAGNOSIS — G8929 Other chronic pain: Secondary | ICD-10-CM

## 2016-10-23 DIAGNOSIS — M6281 Muscle weakness (generalized): Secondary | ICD-10-CM
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
## 2016-10-28 ENCOUNTER — Ambulatory Visit: Payer: BLUE CROSS/BLUE SHIELD | Admitting: Physical Therapy

## 2016-10-28 DIAGNOSIS — M6281 Muscle weakness (generalized): Secondary | ICD-10-CM

## 2016-10-28 DIAGNOSIS — M545 Low back pain: Secondary | ICD-10-CM | POA: Diagnosis not present

## 2016-10-28 DIAGNOSIS — G8929 Other chronic pain: Secondary | ICD-10-CM

## 2016-10-28 NOTE — Therapy (Signed)
[facility] [address] [place], [place], [address] Phone: [phone]   Fax:  [phone]  Physical Therapy Evaluation  Patient Details  Name: Kathy Bradley MRN: [id number] Date of Birth: 07/10/1981 Referring Provider: Verlin Dike, NP  Encounter Date: 10/28/2016      PT End of Session - 10/28/16 1101    Visit Number 5   Number of Visits 12   Date for PT Re-Evaluation 11/18/16   PT Start Time 0900   PT Stop Time 0952   PT Time Calculation (min) 52 min   Activity Tolerance Patient tolerated treatment well   Behavior During Therapy [facility] for tasks assessed/performed      Past Medical History:  Diagnosis Date  . Abnormal Pap smear of cervix     Past Surgical History:  Procedure Laterality Date  . CERVICAL CONIZATION W/BX N/A 08/22/2014   Procedure: CONIZATION CERVIX WITH BIOPSY;  Surgeon: Tilda Burrow, MD;  Location: [facility];  Service: Gynecology;  Laterality: N/A;  . COLPOSCOPY VULVA W/ BIOPSY    . DILATION AND CURETTAGE OF UTERUS    . HOLMIUM LASER APPLICATION N/A 08/22/2014   Procedure: HOLMIUM LASER APPLICATION;  Surgeon: Tilda Burrow, MD;  Location: [facility];  Service: Gynecology;  Laterality: N/A;    There were no vitals filed for this visit.       Subjective Assessment - 10/28/16 1053    Subjective I don't want traction again.  The injection made the left side of my back feel better but now the right side hurts worse.   Pain Score 4    Pain Location Back                       [facility] Adult PT Treatment/Exercise - 10/28/16 0001      Exercises   Exercises Knee/Hip     Knee/Hip Exercises: Aerobic   Stationary Bike level 1 x 10 minutes.     Knee/Hip Exercises: Supine   Other Supine Knee/Hip Exercises Bilateral SKTC 3 reps 30 sec holds.     Modalities   Modalities Ultrasound     Moist Heat Therapy   Number Minutes Moist Heat 15 Minutes   Moist Heat Location Lumbar Spine     Electrical  Stimulation   Electrical Stimulation Location Right low back.   Electrical Stimulation Action Pre-mod   Electrical Stimulation Parameters Constant 80-150 Hz x 15 minutes.   Electrical Stimulation Goals Pain     Ultrasound   Ultrasound Location Left sdly position with pillow between knees for comfort:  U/S at 1.50 w/CM2 x 10 minutes to right low back musculature.   Ultrasound Goals Pain                     PT Long Term Goals - 10/09/16 0955      PT LONG TERM GOAL #1   Title I with HEP   Time 6   Period Weeks     PT LONG TERM GOAL #2   Title Walk a community distance wiht pain not > 3/10.   Time 6   Period Weeks   Status New     PT LONG TERM GOAL #3   Title Sit 30 minutes with pain not > 3/10.   Period Weeks   Status New     PT LONG TERM GOAL #4   Title Eliminate left LE symptoms.   Time 6   Period Weeks   Status New  Plan - 10/28/16 1102    Clinical Impression Statement Patient did well with treatment today.        Patient will benefit from skilled therapeutic intervention in order to improve the following deficits and impairments:     Visit Diagnosis: Chronic midline low back pain, with sciatica presence unspecified  Muscle weakness (generalized)     Problem List Patient Active Problem List   Diagnosis Date Noted  . Essential hypertension 04/03/2016  . Neck pain 01/24/2016  . 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

## 2016-10-30 ENCOUNTER — Ambulatory Visit: Payer: BLUE CROSS/BLUE SHIELD | Admitting: Physical Therapy

## 2016-10-30 DIAGNOSIS — M545 Low back pain: Principal | ICD-10-CM

## 2016-10-30 DIAGNOSIS — M6281 Muscle weakness (generalized): Secondary | ICD-10-CM

## 2016-10-30 DIAGNOSIS — G8929 Other chronic pain: Secondary | ICD-10-CM

## 2016-10-30 NOTE — Therapy (Signed)
[facility] [address] [place], [place], [address] Phone: [phone]   Fax:  [phone]  Physical Therapy Treatment  Patient Details  Name: Kathy Bradley MRN: [id number] Date of Birth: November 15, 1980 Referring Provider: Verlin Dike, NP  Encounter Date: 10/30/2016      PT End of Session - 10/30/16 1012    Visit Number 6   Number of Visits 12   Date for PT Re-Evaluation 11/18/16   PT Start Time 0900   PT Stop Time 0951   PT Time Calculation (min) 51 min   Activity Tolerance Patient tolerated treatment well   Behavior During Therapy [facility] for tasks assessed/performed      Past Medical History:  Diagnosis Date  . Abnormal Pap smear of cervix     Past Surgical History:  Procedure Laterality Date  . CERVICAL CONIZATION W/BX N/A 08/22/2014   Procedure: CONIZATION CERVIX WITH BIOPSY;  Surgeon: Tilda Burrow, MD;  Location: [facility];  Service: Gynecology;  Laterality: N/A;  . COLPOSCOPY VULVA W/ BIOPSY    . DILATION AND CURETTAGE OF UTERUS    . HOLMIUM LASER APPLICATION N/A 08/22/2014   Procedure: HOLMIUM LASER APPLICATION;  Surgeon: Tilda Burrow, MD;  Location: [facility];  Service: Gynecology;  Laterality: N/A;    There were no vitals filed for this visit.      Subjective Assessment - 10/30/16 0910    Subjective I felt better after last treatment.  My pain is a 3/10 today.   Pain Score 3    Pain Location Back   Pain Descriptors / Indicators Aching   Pain Type Chronic pain   Pain Onset More than a month ago                         [facility] Adult PT Treatment/Exercise - 10/30/16 0001      Exercises   Exercises Lumbar;Knee/Hip     Lumbar Exercises: Supine   AB Set Limitations Mini crunches to faigue x 2.   Other Supine Lumbar Exercises Hip bridges to fatigue x 2.     Knee/Hip Exercises: Aerobic   Stationary Bike Level 2 x 10 minutes.     Modalities   Modalities Electrical Stimulation;Moist Heat     Moist Heat Therapy   Number Minutes Moist Heat 15 Minutes   Moist Heat Location Lumbar Spine     Electrical Stimulation   Electrical Stimulation Location Right low back.   Electrical Stimulation Action Pre-mod. x 15 minutes.   Electrical Stimulation Parameters Constant at 80-150 hz x 15 minutes.   Electrical Stimulation Goals Pain     Manual Therapy   Manual Therapy Soft tissue mobilization   Soft tissue mobilization Left sdly:  STW/M and QL releaase x 8 minutes.                     PT Long Term Goals - 10/09/16 0955      PT LONG TERM GOAL #1   Title I with HEP   Time 6   Period Weeks     PT LONG TERM GOAL #2   Title Walk a community distance wiht pain not > 3/10.   Time 6   Period Weeks   Status New     PT LONG TERM GOAL #3   Title Sit 30 minutes with pain not > 3/10.   Period Weeks   Status New     PT LONG TERM GOAL #4  Title Eliminate left LE symptoms.   Time 6   Period Weeks   Status New               Plan - 10/30/16 1013    Clinical Impression Statement Good response to treatment today.  Patient to return to PT early next week and then is scheduled to receive another injection next week (Wednesday).  Recommended she not return to PT until the week following when we will try dry needling.      Patient will benefit from skilled therapeutic intervention in order to improve the following deficits and impairments:     Visit Diagnosis: Chronic midline low back pain, with sciatica presence unspecified  Muscle weakness (generalized)     Problem List Patient Active Problem List   Diagnosis Date Noted  . Essential hypertension 04/03/2016  . Neck pain 01/24/2016  . 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

## 2016-11-03 ENCOUNTER — Ambulatory Visit: Payer: BLUE CROSS/BLUE SHIELD | Admitting: Physical Therapy

## 2016-11-03 DIAGNOSIS — M6281 Muscle weakness (generalized): Secondary | ICD-10-CM

## 2016-11-03 DIAGNOSIS — M545 Low back pain: Secondary | ICD-10-CM | POA: Diagnosis not present

## 2016-11-03 DIAGNOSIS — G8929 Other chronic pain: Secondary | ICD-10-CM

## 2016-11-03 NOTE — Therapy (Signed)
[facility] [address] [place], [place], [address] Phone: [phone]   Fax:  [phone]  Physical Therapy Treatment  Patient Details  Name: Kathy Bradley MRN: [id number] Date of Birth: 1980/07/27 Referring Provider: Verlin Dike, NP  Encounter Date: 11/03/2016      PT End of Session - 11/03/16 0939    Visit Number 7   Number of Visits 12   Date for PT Re-Evaluation 11/18/16   PT Start Time 0900   PT Stop Time 0950   PT Time Calculation (min) 50 min   Activity Tolerance Patient tolerated treatment well   Behavior During Therapy [facility] for tasks assessed/performed      Past Medical History:  Diagnosis Date  . Abnormal Pap smear of cervix     Past Surgical History:  Procedure Laterality Date  . CERVICAL CONIZATION W/BX N/A 08/22/2014   Procedure: CONIZATION CERVIX WITH BIOPSY;  Surgeon: Tilda Burrow, MD;  Location: [facility];  Service: Gynecology;  Laterality: N/A;  . COLPOSCOPY VULVA W/ BIOPSY    . DILATION AND CURETTAGE OF UTERUS    . HOLMIUM LASER APPLICATION N/A 08/22/2014   Procedure: HOLMIUM LASER APPLICATION;  Surgeon: Tilda Burrow, MD;  Location: [facility];  Service: Gynecology;  Laterality: N/A;    There were no vitals filed for this visit.      Subjective Assessment - 11/03/16 0906    Subjective I'm doing okay.  My pain is about a 4/10 now.   Pain Score 4    Pain Location Back   Pain Orientation Right;Left;Mid   Pain Descriptors / Indicators Aching   Pain Type Chronic pain   Pain Onset More than a month ago   Pain Frequency Constant                         [facility] Adult PT Treatment/Exercise - 11/03/16 0001      Exercises   Exercises Knee/Hip     Lumbar Exercises: Aerobic   Stationary Bike Level 2 x 16 minutes.     Modalities   Modalities Electrical Stimulation;Moist Heat     Moist Heat Therapy   Number Minutes Moist Heat 15 Minutes   Moist Heat Location Lumbar Spine     Electrical Stimulation   Electrical Stimulation Location Lumbar region.   Electrical Stimulation Action IFC   Electrical Stimulation Parameters 80-150 Hz x 15 minutes.   Electrical Stimulation Goals Pain     Manual Therapy   Manual Therapy Soft tissue mobilization   Soft tissue mobilization Prone over pillow;  STw/M x 7 minutes including right QL release technique.                     PT Long Term Goals - 10/09/16 0955      PT LONG TERM GOAL #1   Title I with HEP   Time 6   Period Weeks     PT LONG TERM GOAL #2   Title Walk a community distance wiht pain not > 3/10.   Time 6   Period Weeks   Status New     PT LONG TERM GOAL #3   Title Sit 30 minutes with pain not > 3/10.   Period Weeks   Status New     PT LONG TERM GOAL #4   Title Eliminate left LE symptoms.   Time 6   Period Weeks   Status New  Plan - 11/03/16 1006    Clinical Impression Statement Patient was up standing a lot this weekend.  Pain 4/10 today.     PT Next Visit Plan Work on back stabilization exercises.      Patient will benefit from skilled therapeutic intervention in order to improve the following deficits and impairments:     Visit Diagnosis: Chronic midline low back pain, with sciatica presence unspecified  Muscle weakness (generalized)     Problem List Patient Active Problem List   Diagnosis Date Noted  . Essential hypertension 04/03/2016  . Neck pain 01/24/2016  . 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

## 2016-11-04 ENCOUNTER — Ambulatory Visit: Payer: BLUE CROSS/BLUE SHIELD | Admitting: Physical Therapy

## 2016-11-04 DIAGNOSIS — G8929 Other chronic pain: Secondary | ICD-10-CM

## 2016-11-04 DIAGNOSIS — M6281 Muscle weakness (generalized): Secondary | ICD-10-CM

## 2016-11-04 DIAGNOSIS — M545 Low back pain: Secondary | ICD-10-CM | POA: Diagnosis not present

## 2016-11-04 NOTE — Therapy (Signed)
[facility] [address] [place], [place], [address] Phone: [phone]   Fax:  [phone]  Physical Therapy Treatment  Patient Details  Name: Kathy Bradley MRN: [id number] Date of Birth: 12-02-1980 Referring Provider: Verlin Dike, NP  Encounter Date: 11/04/2016      PT End of Session - 11/04/16 1106    Visit Number 8   Number of Visits 12   Date for PT Re-Evaluation 11/18/16   PT Start Time 1030   PT Stop Time 1121   PT Time Calculation (min) 51 min   Activity Tolerance Patient tolerated treatment well   Behavior During Therapy [facility] for tasks assessed/performed      Past Medical History:  Diagnosis Date  . Abnormal Pap smear of cervix     Past Surgical History:  Procedure Laterality Date  . CERVICAL CONIZATION W/BX N/A 08/22/2014   Procedure: CONIZATION CERVIX WITH BIOPSY;  Surgeon: Tilda Burrow, MD;  Location: [facility];  Service: Gynecology;  Laterality: N/A;  . COLPOSCOPY VULVA W/ BIOPSY    . DILATION AND CURETTAGE OF UTERUS    . HOLMIUM LASER APPLICATION N/A 08/22/2014   Procedure: HOLMIUM LASER APPLICATION;  Surgeon: Tilda Burrow, MD;  Location: [facility];  Service: Gynecology;  Laterality: N/A;    There were no vitals filed for this visit.      Subjective Assessment - 11/04/16 1106    Subjective I felt good after last treatment.  I went home and did housework which including mopping and bending over picking up dog toys.  Now my pain is a 6/10.  It feels like a vise around my hips with pain going into my legs.  I get another injection tomorrow.   Patient Stated Goals Get out of pain and return to a normal life.   Pain Score 6    Pain Location Back   Pain Orientation Right;Left;Mid   Pain Descriptors / Indicators Aching   Pain Type Chronic pain   Pain Onset More than a month ago   Multiple Pain Sites No                         [facility] Adult PT Treatment/Exercise - 11/04/16 0001      Exercises    Exercises Knee/Hip     Lumbar Exercises: Aerobic   Stationary Bike level 2 x 15 minutes.     Moist Heat Therapy   Number Minutes Moist Heat 15 Minutes   Moist Heat Location Lumbar Spine     Electrical Stimulation   Electrical Stimulation Location Lumbar region.   Electrical Stimulation Action IFC   Electrical Stimulation Parameters 80-150 Hz x 15 minutes.   Electrical Stimulation Goals Pain     Manual Therapy   Manual Therapy Soft tissue mobilization   Soft tissue mobilization STW/M and QL release x 8 minutes.                     PT Long Term Goals - 10/09/16 0955      PT LONG TERM GOAL #1   Title I with HEP   Time 6   Period Weeks     PT LONG TERM GOAL #2   Title Walk a community distance wiht pain not > 3/10.   Time 6   Period Weeks   Status New     PT LONG TERM GOAL #3   Title Sit 30 minutes with pain not > 3/10.  Period Weeks   Status New     PT LONG TERM GOAL #4   Title Eliminate left LE symptoms.   Time 6   Period Weeks   Status New               Plan - 11/04/16 1205    Clinical Impression Statement patient with less pain after treatment but her back flare-ups with basic household activites.      Patient will benefit from skilled therapeutic intervention in order to improve the following deficits and impairments:     Visit Diagnosis: Chronic midline low back pain, with sciatica presence unspecified  Muscle weakness (generalized)     Problem List Patient Active Problem List   Diagnosis Date Noted  . Essential hypertension 04/03/2016  . Neck pain 01/24/2016  . 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 [place], [place], [address] Phone: [phone]   Fax:  [phone]  Name: Kathy Bradley MRN: [id number] Date of Birth: January 05, 1981

## 2016-11-10 ENCOUNTER — Ambulatory Visit: Payer: BLUE CROSS/BLUE SHIELD | Admitting: Physical Therapy

## 2016-11-10 DIAGNOSIS — M545 Low back pain: Secondary | ICD-10-CM | POA: Diagnosis not present

## 2016-11-10 DIAGNOSIS — G8929 Other chronic pain: Secondary | ICD-10-CM
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
## 2016-11-10 NOTE — Patient Instructions (Signed)
Piriformis (Supine)  Cross legs, right on top. Gently pull other knee toward chest until stretch is felt in buttock/hip of top leg. Hold _60___ seconds. Repeat _2___ times per set. Do ____ sets per session. Do __2__ sessions per day.   Stretching: Piriformis   Cross left leg over other thigh and place elbow over outside of knee. Gently stretch buttock muscles by pushing bent knee across body. Hold 60____ seconds. Repeat __2__ times per set. Do ____ sets per session. Do __2__ sessions per day.  Stretching: Piriformis (Supine)  Pull right knee toward opposite shoulder. Hold __60__ seconds. Relax. Repeat ____ times per set. Do ____ sets per session. Do _2___ sessions per day.  Trigger Point Dry Needling  . What is Trigger Point Dry Needling (DN)? o DN is a physical therapy technique used to treat muscle pain and dysfunction. Specifically, DN helps deactivate muscle trigger points (muscle knots).  o A thin filiform needle is used to penetrate the skin and stimulate the underlying trigger point. The goal is for a local twitch response (LTR) to occur and for the trigger point to relax. No medication of any kind is injected during the procedure.   . What Does Trigger Point Dry Needling Feel Like?  o The procedure feels different for each individual patient. Some patients report that they do not actually feel the needle enter the skin and overall the process is not painful. Very mild bleeding may occur. However, many patients feel a deep cramping in the muscle in which the needle was inserted. This is the local twitch response.   Marland Kitchen How Will I feel after the treatment? o Soreness is normal, and the onset of soreness may not occur for a few hours. Typically this soreness does not last longer than two days.  o Bruising is uncommon, however; ice can be used to decrease any possible bruising.  o In rare cases feeling tired or nauseous after the treatment is normal. In addition, your symptoms may get  worse before they get better, this period will typically not last longer than 24 hours.   . What Can I do After My Treatment? o Increase your hydration by drinking more water for the next 24 hours. o You may place ice or heat on the areas treated that have become sore, however, do not use heat on inflamed or bruised areas. Heat often brings more relief post needling. o You can continue your regular activities, but vigorous activity is not recommended initially after the treatment for 24 hours. o DN is best combined with other physical therapy such as strengthening, stretching, and other therapies.    Precautions:  In some cases, dry needling is done over the lung field. While rare, there is a risk of pneumothorax (punctured lung). Because of this, if you ever experience shortness of breath on exertion, difficulty taking a deep breath, chest pain or a dry cough following dry needling, you should report to an emergency room and tell them that you have been dry needled over the thorax.   Solon Palm, PT 11/10/16 9:44 AM;[facility] [address] [place], [place], [address] Phone: [phone]   Fax:  [phone]  2

## 2016-11-12 ENCOUNTER — Other Ambulatory Visit: Payer: Self-pay | Admitting: *Deleted

## 2016-11-12 MED ORDER — GABAPENTIN 300 MG PO CAPS: ORAL_CAPSULE | ORAL | 3 refills | Status: DC

## 2016-11-12 MED ORDER — CARVEDILOL 25 MG PO TABS: 25.0000 mg | ORAL_TABLET | Freq: Two times a day (BID) | ORAL | 3 refills | Status: DC

## 2016-11-13 ENCOUNTER — Ambulatory Visit: Payer: BLUE CROSS/BLUE SHIELD | Admitting: Physical Therapy

## 2016-11-13 DIAGNOSIS — G8929 Other chronic pain: Secondary | ICD-10-CM

## 2016-11-13 DIAGNOSIS — M6281 Muscle weakness (generalized): Secondary | ICD-10-CM

## 2016-11-13 DIAGNOSIS — M545 Low back pain: Secondary | ICD-10-CM | POA: Diagnosis not present

## 2016-11-13 NOTE — Therapy (Signed)
[facility] [address] [place], [place], [address] Phone: [phone]   Fax:  [phone]  Physical Therapy Evaluation  Patient Details  Name: Kathy Bradley MRN: [id number] Date of Birth: 1981/06/21 Referring Provider: Verlin Dike, NP  Encounter Date: 11/13/2016      PT End of Session - 11/13/16 0915    Visit Number 10   Number of Visits 12   Date for PT Re-Evaluation 11/18/16   PT Start Time 0900   PT Stop Time 0953   PT Time Calculation (min) 53 min   Activity Tolerance Patient tolerated treatment well   Behavior During Therapy [facility] for tasks assessed/performed      Past Medical History:  Diagnosis Date  . Abnormal Pap smear of cervix     Past Surgical History:  Procedure Laterality Date  . CERVICAL CONIZATION W/BX N/A 08/22/2014   Procedure: CONIZATION CERVIX WITH BIOPSY;  Surgeon: Tilda Burrow, MD;  Location: [facility];  Service: Gynecology;  Laterality: N/A;  . COLPOSCOPY VULVA W/ BIOPSY    . DILATION AND CURETTAGE OF UTERUS    . HOLMIUM LASER APPLICATION N/A 08/22/2014   Procedure: HOLMIUM LASER APPLICATION;  Surgeon: Tilda Burrow, MD;  Location: [facility];  Service: Gynecology;  Laterality: N/A;    There were no vitals filed for this visit.       Subjective Assessment - 11/13/16 0951    Subjective Injection last Wednesday didn't help.  The dry needling hurt.  I feel 50% better overall.  I getting one more injection and then see the Neurologist after that and after doing my last 2 therapy visits.   Pain Score 5    Pain Location Back                       [facility] Adult PT Treatment/Exercise - 11/13/16 0001      Exercises   Exercises Knee/Hip     Lumbar Exercises: Aerobic   Stationary Bike Level 3 x 15 minutes.     Modalities   Modalities Electrical Stimulation;Moist Heat     Moist Heat Therapy   Number Minutes Moist Heat 20 Minutes   Moist Heat Location Lumbar Spine     Electrical  Stimulation   Electrical Stimulation Location Lumbar.   Electrical Stimulation Action Pre-mod.   Electrical Stimulation Parameters 80-150 Hz x 20 minutes.   Electrical Stimulation Goals Pain     Manual Therapy   Manual Therapy Soft tissue mobilization   Soft tissue mobilization Prone:  STW/M to affected lumbar region x 10 minutes.                     PT Long Term Goals - 10/09/16 0955      PT LONG TERM GOAL #1   Title I with HEP   Time 6   Period Weeks     PT LONG TERM GOAL #2   Title Walk a community distance wiht pain not > 3/10.   Time 6   Period Weeks   Status New     PT LONG TERM GOAL #3   Title Sit 30 minutes with pain not > 3/10.   Period Weeks   Status New     PT LONG TERM GOAL #4   Title Eliminate left LE symptoms.   Time 6   Period Weeks   Status New               Plan -  11/13/16 1025    Clinical Impression Statement Patient did well today.  She reports overall she feels 50% better.      Patient will benefit from skilled therapeutic intervention in order to improve the following deficits and impairments:  Pain, Decreased activity tolerance, Decreased strength, Difficulty walking, Decreased mobility  Visit Diagnosis: Chronic midline low back pain, with sciatica presence unspecified  Muscle weakness (generalized)     Problem List Patient Active Problem List   Diagnosis Date Noted  . Essential hypertension 04/03/2016  . Neck pain 01/24/2016  . 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

## 2016-11-18 ENCOUNTER — Ambulatory Visit: Payer: BLUE CROSS/BLUE SHIELD | Attending: Student | Admitting: Physical Therapy

## 2016-11-18 DIAGNOSIS — M6281 Muscle weakness (generalized): Secondary | ICD-10-CM | POA: Diagnosis present

## 2016-11-18 DIAGNOSIS — G8929 Other chronic pain: Secondary | ICD-10-CM | POA: Insufficient documentation

## 2016-11-18 DIAGNOSIS — M545 Low back pain: Secondary | ICD-10-CM | POA: Diagnosis present

## 2016-11-18 NOTE — Therapy (Signed)
[facility] [address] [place], [place], [address] Phone: [phone]   Fax:  [phone]  Physical Therapy Treatment  Patient Details  Name: Kathy Bradley MRN: [id number] Date of Birth: 1980/12/03 Referring Provider: Verlin Dike, NP  Encounter Date: 11/18/2016      PT End of Session - 11/18/16 1114    Visit Number 11   Number of Visits 12   Date for PT Re-Evaluation 11/18/16   PT Start Time 0947   PT Stop Time 1040   PT Time Calculation (min) 53 min   Activity Tolerance Patient tolerated treatment well   Behavior During Therapy [facility] for tasks assessed/performed      Past Medical History:  Diagnosis Date  . Abnormal Pap smear of cervix     Past Surgical History:  Procedure Laterality Date  . CERVICAL CONIZATION W/BX N/A 08/22/2014   Procedure: CONIZATION CERVIX WITH BIOPSY;  Surgeon: Tilda Burrow, MD;  Location: [facility];  Service: Gynecology;  Laterality: N/A;  . COLPOSCOPY VULVA W/ BIOPSY    . DILATION AND CURETTAGE OF UTERUS    . HOLMIUM LASER APPLICATION N/A 08/22/2014   Procedure: HOLMIUM LASER APPLICATION;  Surgeon: Tilda Burrow, MD;  Location: [facility];  Service: Gynecology;  Laterality: N/A;    There were no vitals filed for this visit.      Subjective Assessment - 11/18/16 1115    Subjective My pain-level is a 3/10 today.  I had a good weekend.  When I first get up in the nmorningf I hurt a lot and then when I'm up awhile my pin goes down.  If i do too much my pain goes up again.   Pain Score 3    Pain Location Back   Pain Orientation Right;Left;Lower   Pain Descriptors / Indicators Aching   Pain Type Chronic pain   Pain Onset More than a month ago                         [facility] Adult PT Treatment/Exercise - 11/18/16 0001      Exercises   Exercises Knee/Hip     Lumbar Exercises: Aerobic   Stationary Bike Level 3 x 20 minutes f/b prone single leg and double leg lifts x 5.      Modalities   Modalities Electrical Stimulation;Moist Heat     Moist Heat Therapy   Number Minutes Moist Heat 20 Minutes   Moist Heat Location Lumbar Spine     Electrical Stimulation   Electrical Stimulation Location --  Lumbar.   Electrical Stimulation Action Pre-mod.   Electrical Stimulation Parameters 80-150 Hz x 20 minutes.   Electrical Stimulation Goals Pain     Manual Therapy   Manual Therapy Soft tissue mobilization   Soft tissue mobilization In prone over pillow:  STW/M to affected lumbar musculature x 6 minutes.                     PT Long Term Goals - 10/09/16 0955      PT LONG TERM GOAL #1   Title I with HEP   Time 6   Period Weeks     PT LONG TERM GOAL #2   Title Walk a community distance wiht pain not > 3/10.   Time 6   Period Weeks   Status New     PT LONG TERM GOAL #3   Title Sit 30 minutes with pain not > 3/10.  Period Weeks   Status New     PT LONG TERM GOAL #4   Title Eliminate left LE symptoms.   Time 6   Period Weeks   Status New               Plan - 11/18/16 1118    Clinical Impression Statement Pain lowered over weekend.  Patient has one visit remaining.      Patient will benefit from skilled therapeutic intervention in order to improve the following deficits and impairments:  Pain, Decreased activity tolerance, Decreased strength, Difficulty walking, Decreased mobility  Visit Diagnosis: Chronic midline low back pain, with sciatica presence unspecified  Muscle weakness (generalized)     Problem List Patient Active Problem List   Diagnosis Date Noted  . Essential hypertension 04/03/2016  . Neck pain 01/24/2016  . 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 [place], [place], [address] Phone: [phone]   Fax:   [phone]  Name: Kathy Bradley MRN: [id number] Date of Birth: Jul 09, 1981

## 2016-11-20 ENCOUNTER — Ambulatory Visit: Payer: BLUE CROSS/BLUE SHIELD | Admitting: Physical Therapy

## 2016-11-20 DIAGNOSIS — G8929 Other chronic pain: Secondary | ICD-10-CM

## 2016-11-20 DIAGNOSIS — M545 Low back pain: Secondary | ICD-10-CM | POA: Diagnosis not present

## 2016-11-20 DIAGNOSIS — M6281 Muscle weakness (generalized): Secondary | ICD-10-CM

## 2016-11-20 NOTE — Therapy (Addendum)
[facility] [place], [place], [address] Phone: [phone]   Fax:  [phone]  Physical Therapy Treatment  Patient Details  Name: Kathy Bradley MRN: [id number] Date of Birth: [birth year] Referring Provider: Glenford Peers, NP  Encounter Date: 11/20/2016      PT End of Session - 11/20/16 1328    Visit Number 12   Number of Visits 12   Date for PT Re-Evaluation 11/18/16   PT Start Time 1115   PT Stop Time 1205   PT Time Calculation (min) 50 min   Activity Tolerance Patient tolerated treatment well   Behavior During Therapy [facility] for tasks assessed/performed      Past Medical History:  Diagnosis Date  . Abnormal Pap smear of cervix     Past Surgical History:  Procedure Laterality Date  . CERVICAL CONIZATION W/BX N/A 08/22/2014   Procedure: CONIZATION CERVIX WITH BIOPSY;  Surgeon: Jonnie Kind, MD;  Location: [facility];  Service: Gynecology;  Laterality: N/A;  . COLPOSCOPY VULVA W/ BIOPSY    . DILATION AND CURETTAGE OF UTERUS    . HOLMIUM LASER APPLICATION N/A 7/0/6237   Procedure: HOLMIUM LASER APPLICATION;  Surgeon: Jonnie Kind, MD;  Location: [facility];  Service: Gynecology;  Laterality: N/A;    There were no vitals filed for this visit.      Subjective Assessment - 11/20/16 1331    Subjective Today is my last.  I feel good.  My pain is a 2/10.  I popped my neck yesterday and it felt good.  I'm thinking of going to a Chiropractor.   Patient Stated Goals Get out of pain and return to a normal life.   Pain Score 2    Pain Location Back   Pain Orientation Right;Left;Lower   Pain Descriptors / Indicators Aching   Pain Onset More than a month ago                         [facility] Adult PT Treatment/Exercise - 11/20/16 0001      Exercises   Exercises Knee/Hip     Lumbar Exercises: Aerobic   Stationary Bike Level 2 x 15 minutes.     Acupuncturist Location --   Lumbar.   Electrical Stimulation Action Pre-mod   Electrical Stimulation Parameters 80-150 Hz x 15 minutes.   Electrical Stimulation Goals Pain     Manual Therapy   Manual Therapy Soft tissue mobilization   Soft tissue mobilization STW/M x 8 minutes to affected lumbar musculature.                     PT Long Term Goals - 11/20/16 1328      PT LONG TERM GOAL #1   Title I with HEP   Time 6   Period Weeks   Status Achieved     PT LONG TERM GOAL #2   Title Walk a community distance wiht pain not > 3/10.   Time 6   Period Weeks   Status Achieved     PT LONG TERM GOAL #3   Title Sit 30 minutes with pain not > 3/10.   Time 6   Period Weeks   Status Achieved     PT LONG TERM GOAL #4   Title Eliminate left LE symptoms.   Baseline Most days.   Time 6   Period Weeks   Status Partially Met  PT LONG TERM GOAL #5   Title Perform ADL's with pain not > 3/10.   Baseline Pain increase to higher levels the longer she performs ADL's.   Time 4   Period Weeks   Status Not Met               Plan - 11/20/16 1404    Clinical Impression Statement Please see discharge summary.      Patient will benefit from skilled therapeutic intervention in order to improve the following deficits and impairments:     Visit Diagnosis: Chronic midline low back pain, with sciatica presence unspecified  Muscle weakness (generalized)     Problem List Patient Active Problem List   Diagnosis Date Noted  . Essential hypertension 04/03/2016  . Neck pain 01/24/2016  . 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  Current functional level related to goals / functional outcomes: See above.   Remaining deficits: See goal assessment above.   Education / Equipment: HEP. Plan: Patient agrees to discharge.  Patient goals were partially met.  Patient is being discharged due to being pleased with the current functional level.  ?????      Trey Bebee, [place]  MPT 11/20/2016, 2:10 PM  [facility] [address] [place], [place], [address] Phone: [phone]   Fax:  [phone]  Name: SEATTLE DALPORTO MRN: [id number] Date of Birth: [birth year]

## 2016-11-25 ENCOUNTER — Encounter: Payer: Self-pay | Admitting: Pediatrics

## 2016-11-25 ENCOUNTER — Ambulatory Visit (INDEPENDENT_AMBULATORY_CARE_PROVIDER_SITE_OTHER): Payer: BLUE CROSS/BLUE SHIELD | Admitting: Pediatrics

## 2016-11-25 ENCOUNTER — Ambulatory Visit: Payer: BLUE CROSS/BLUE SHIELD

## 2016-11-25 VITALS — BP 136/78 | HR 83 | Temp 98.4°F | Ht 65.0 in | Wt 199.6 lb

## 2016-11-25 DIAGNOSIS — Z720 Tobacco use: Secondary | ICD-10-CM

## 2016-11-25 DIAGNOSIS — Z01818 Encounter for other preprocedural examination: Secondary | ICD-10-CM

## 2016-11-25 DIAGNOSIS — I10 Essential (primary) hypertension: Secondary | ICD-10-CM | POA: Diagnosis not present

## 2016-11-25 DIAGNOSIS — Z6833 Body mass index (BMI) 33.0-33.9, adult: Secondary | ICD-10-CM

## 2016-11-25 LAB — URINALYSIS, COMPLETE
Bilirubin, UA: NEGATIVE
Glucose, UA: NEGATIVE
Leukocytes, UA: NEGATIVE
Nitrite, UA: NEGATIVE
Specific Gravity, UA: 1.02 (ref 1.005–1.030)
Urobilinogen, Ur: 1 mg/dL (ref 0.2–1.0)
pH, UA: 6 (ref 5.0–7.5)

## 2016-11-25 LAB — MICROSCOPIC EXAMINATION
Bacteria, UA: NONE SEEN
Epithelial Cells (non renal): >10 /hpf — AB (ref 0–10)
Renal Epithel, UA: NONE SEEN /hpf

## 2016-11-25 MED ORDER — HYDROCHLOROTHIAZIDE 25 MG PO TABS: 25.0000 mg | ORAL_TABLET | Freq: Every day | ORAL | 3 refills | Status: DC
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
## 2016-11-26 ENCOUNTER — Other Ambulatory Visit: Payer: Self-pay

## 2016-11-26 DIAGNOSIS — Z720 Tobacco use: Secondary | ICD-10-CM | POA: Insufficient documentation

## 2016-11-26 DIAGNOSIS — Z6833 Body mass index (BMI) 33.0-33.9, adult: Secondary | ICD-10-CM | POA: Insufficient documentation

## 2016-11-26 LAB — CBC WITH DIFFERENTIAL/PLATELET
Basophils Absolute: 0 10*3/uL (ref 0.0–0.2)
Basos: 0 %
EOS (ABSOLUTE): 0.1 10*3/uL (ref 0.0–0.4)
Eos: 1 %
Hematocrit: 42.5 % (ref 34.0–46.6)
Hemoglobin: 14.4 g/dL (ref 11.1–15.9)
Immature Grans (Abs): 0 10*3/uL (ref 0.0–0.1)
Immature Granulocytes: 0 %
Lymphocytes Absolute: 2.2 10*3/uL (ref 0.7–3.1)
Lymphs: 29 %
MCH: 29.8 pg (ref 26.6–33.0)
MCHC: 33.9 g/dL (ref 31.5–35.7)
MCV: 88 fL (ref 79–97)
Monocytes Absolute: 0.5 10*3/uL (ref 0.1–0.9)
Monocytes: 6 %
Neutrophils Absolute: 4.8 10*3/uL (ref 1.4–7.0)
Neutrophils: 64 %
Platelets: 196 10*3/uL (ref 150–379)
RBC: 4.84 x10E6/uL (ref 3.77–5.28)
RDW: 14.7 % (ref 12.3–15.4)
WBC: 7.5 10*3/uL (ref 3.4–10.8)

## 2016-11-26 LAB — CMP14+EGFR
ALT: 26 IU/L (ref 0–32)
AST: 8 IU/L (ref 0–40)
Albumin/Globulin Ratio: 2 (ref 1.2–2.2)
Albumin: 4.5 g/dL (ref 3.5–5.5)
Alkaline Phosphatase: 48 IU/L (ref 39–117)
BUN/Creatinine Ratio: 14 (ref 9–23)
BUN: 11 mg/dL (ref 6–20)
Bilirubin Total: 0.3 mg/dL (ref 0.0–1.2)
CO2: 25 mmol/L (ref 18–29)
Calcium: 9.3 mg/dL (ref 8.7–10.2)
Chloride: 99 mmol/L (ref 96–106)
Creatinine, Ser: 0.78 mg/dL (ref 0.57–1.00)
GFR calc Af Amer: 116 mL/min/{1.73_m2} (ref >59)
GFR calc non Af Amer: 100 mL/min/{1.73_m2} (ref >59)
Globulin, Total: 2.3 g/dL (ref 1.5–4.5)
Glucose: 95 mg/dL (ref 65–99)
Potassium: 4.1 mmol/L (ref 3.5–5.2)
Sodium: 139 mmol/L (ref 134–144)
Total Protein: 6.8 g/dL (ref 6.0–8.5)

## 2016-11-26 LAB — TSH: TSH: 1.11 u[IU]/mL (ref 0.450–4.500)

## 2016-11-26 MED ORDER — GABAPENTIN 300 MG PO CAPS: ORAL_CAPSULE | ORAL | 0 refills | Status: DC

## 2017-03-12 ENCOUNTER — Other Ambulatory Visit: Payer: Self-pay | Admitting: Family Medicine

## 2017-05-10 ENCOUNTER — Other Ambulatory Visit: Payer: Self-pay | Admitting: Family Medicine

## 2017-06-11 ENCOUNTER — Other Ambulatory Visit: Payer: Self-pay | Admitting: Pediatrics

## 2017-07-08 ENCOUNTER — Other Ambulatory Visit: Payer: Self-pay | Admitting: Pediatrics

## 2017-07-15 ENCOUNTER — Encounter: Payer: Self-pay | Admitting: Family Medicine

## 2017-07-15 ENCOUNTER — Ambulatory Visit: Payer: BLUE CROSS/BLUE SHIELD | Admitting: Family Medicine

## 2017-07-15 VITALS — BP 124/88 | HR 83 | Temp 97.8°F | Ht 65.0 in | Wt 198.0 lb

## 2017-07-15 DIAGNOSIS — G8929 Other chronic pain: Secondary | ICD-10-CM

## 2017-07-15 DIAGNOSIS — M545 Low back pain: Secondary | ICD-10-CM | POA: Diagnosis not present

## 2017-07-15 DIAGNOSIS — Z720 Tobacco use: Secondary | ICD-10-CM

## 2017-07-15 DIAGNOSIS — I10 Essential (primary) hypertension: Secondary | ICD-10-CM | POA: Diagnosis not present

## 2017-07-15 MED ORDER — CYCLOBENZAPRINE HCL 10 MG PO TABS: 10.0000 mg | ORAL_TABLET | Freq: Every day | ORAL | 1 refills | Status: DC | PRN

## 2017-07-15 MED ORDER — CARVEDILOL 25 MG PO TABS: 25.0000 mg | ORAL_TABLET | Freq: Two times a day (BID) | ORAL | 1 refills | Status: DC

## 2017-07-15 MED ORDER — GABAPENTIN 300 MG PO CAPS: ORAL_CAPSULE | ORAL | 1 refills | Status: DC

## 2017-07-15 MED ORDER — HYDROCHLOROTHIAZIDE 25 MG PO TABS: 25.0000 mg | ORAL_TABLET | Freq: Every day | ORAL | 1 refills | Status: DC

## 2017-07-15 NOTE — Assessment & Plan Note (Signed)
Blood pressure well controlled on current regimen.  Continue Coreg 25 mg p.o. twice daily and hydrochlorothiazide 25 mg daily. 11/2016 blood labs were reviewed which were unremarkable.  Smoking cessation encouraged.  6 months of refills were provided to the patient today.

## 2017-07-15 NOTE — Assessment & Plan Note (Signed)
Patient with an 18-year pack history.  She is pre-contemplative at this time.  Counseling performed today.

## 2017-07-15 NOTE — Assessment & Plan Note (Signed)
Significantly improved since surgery but continues to have intermittent low back spasm.  I did discuss with her that physical therapy may be in her best interest to prevent further lumbar muscle spasms.  She seems interested in this but needs to work out insurance first, as she is currently unemployed after the surgery.  We discussed that Flexeril would not be a good long-term medication.  Caution sedation.  Continue to improve core and stay mobile so as to reduce frequency of lumbar spasm.

## 2017-07-15 NOTE — Progress Notes (Signed)
Subjective: CC: HTN HPI: Kathy Bradley is a 36 y.o. female presenting to clinic today for:  1. Hypertension Patient reports that she does not monitor BP at home; Meds: Compliant with Coreg, HCTZ, Side effects: none.  She rarely has LE edema unless she eats large amounts of salt.  Denies headache, dizziness, visual changes, nausea, vomiting, chest pain, abdominal pain or shortness of breath.  She is an every day smoker  2. Back pain Patient had a laminectomy earlier this year.  She notes that back pain is significantly improved compared to prior to the surgery.  She denies weakness, numbness, tingling.  She notes that she will have muscle spasm of the lumbosacral regions bilaterally at the level of the surgery.  She notes that since the surgery, she has not returned to work and sits around the house quite often.  When she gets up to move around is when she notes the knots and spasm are the worst.  She also notes worsening spasm with forward bending/flexion.  Flexeril provides good relief and she takes this maybe once every other day.  She is not ever had physical therapy after the surgery but is interested in this once her insurance is worked out.  No excessive sedation from the Flexeril.  No shortness of breath.  3.  Tobacco use disorder Patient reports that she has been a smoker since the age of 36 or [age].  She smokes 1 pack/day.  Denies shortness of breath, hemoptysis, unplanned weight loss, chest pain.  She has hypertension as above.  She attempted to quit before using a vapor but was unsuccessful.  She is pre-contemplative at this time.  ROS: Per HPI  Past Medical History:  Diagnosis Date  . Abnormal Pap smear of cervix   . Hypertension    Allergies  Allergen Reactions  . Penicillins Hives    No hx of anaphylaxis    Current Outpatient Medications:  .  carvedilol (COREG) 25 MG tablet, Take 1 tablet (25 mg total) by mouth 2 (two) times daily with a meal., Disp: 180 tablet, Rfl:  1 .  gabapentin (NEURONTIN) 300 MG capsule, TAKE 1 CAPSULE FOUR TIMES A DAY, Disp: 360 capsule, Rfl: 1 .  hydrochlorothiazide (HYDRODIURIL) 25 MG tablet, Take 1 tablet (25 mg total) by mouth daily., Disp: 90 tablet, Rfl: 1 .  levonorgestrel (MIRENA) 20 MCG/24HR IUD, 1 each by Intrauterine route once., Disp: , Rfl:  .  cyclobenzaprine (FLEXERIL) 10 MG tablet, Take 1 tablet (10 mg total) by mouth daily as needed., Disp: 90 tablet, Rfl: 1 Social History   Socioeconomic History  . Marital status: Divorced    Spouse name: Not on file  . Number of children: 1  . Years of education: Not on file  . Highest education level: Not on file  Social Needs  . Financial resource strain: Not on file  . Food insecurity - worry: Not on file  . Food insecurity - inability: Not on file  . Transportation needs - medical: Not on file  . Transportation needs - non-medical: Not on file  Occupational History  . Occupation: unemployed  Tobacco Use  . Smoking status: Current Every Day Smoker    Packs/day: 1.00    Years: 15.00    Pack years: 15.00    Types: Cigarettes    Last attempt to quit: 11/18/2013    Years since quitting: 3.6  . Smokeless tobacco: Never Used  Substance and Sexual Activity  . Alcohol use: Yes  Comment: occ.  . Drug use: No  . Sexual activity: Yes    Birth control/protection: IUD  Other Topics Concern  . Not on file  Social History Narrative  . Not on file   Family History  Problem Relation Age of Onset  . Hypertension Mother   . Hypertension Father   . Hypertension Maternal Grandmother   . Hypertension Maternal Grandfather   . Cancer Paternal Aunt        cervical  . Cancer Paternal Grandmother        ovarian   Objective: Office vital signs reviewed. BP 124/88   Pulse 83   Temp 97.8 F (36.6 C) (Oral)   Ht 5\' 5"  (1.651 m)   Wt 198 lb (89.8 kg)   BMI 32.95 kg/m   Physical Examination:  General: Awake, alert, well nourished, No acute distress HEENT: Normal, MMM,  sclera white Cardio: regular rate and rhythm, S1S2 heard, no murmurs appreciated Pulm: clear to auscultation bilaterally, no wheezes, rhonchi or rales; normal work of breathing on room air MSK: Ambulates independently.  2 inch midline scar appreciated at the L5 level of the lumbar spine.  This is well-healed.  No midline tenderness to palpation to the entire spine.  Minimal paraspinal tenderness to palpation of the lumbosacral area. Extremities: Warm, well-perfused, no edema Neuro: Light touch sensation grossly intact to the lower extremities.  No focal neurologic deficits. Psych: Mood stable, speech normal, affect appropriate  Assessment/ Plan: 36 y.o. female   Essential hypertension Blood pressure well controlled on current regimen.  Continue Coreg 25 mg p.o. twice daily and hydrochlorothiazide 25 mg daily. 11/2016 blood labs were reviewed which were unremarkable.  Smoking cessation encouraged.  6 months of refills were provided to the patient today.   Tobacco use Patient with an 18-year pack history.  She is pre-contemplative at this time.  Counseling performed today.   Chronic bilateral low back pain without sciatica Significantly improved since surgery but continues to have intermittent low back spasm.  I did discuss with her that physical therapy may be in her best interest to prevent further lumbar muscle spasms.  She seems interested in this but needs to work out insurance first, as she is currently unemployed after the surgery.  We discussed that Flexeril would not be a good long-term medication.  Caution sedation.  Continue to improve core and stay mobile so as to reduce frequency of lumbar spasm.    Raliegh IpAshly M Abdulla Pooley, DO [facility] [phone]

## 2017-07-15 NOTE — Patient Instructions (Addendum)
It was a pleasure seeing you today, [name]. Please make an appointment to see me in 6 months for blood pressure follow up or sooner if needed.  Please feel free to call our office if any questions or concerns arise.  I value your feedback and appreciate you entrusting us with your care.  If you get a survey, I would appreciate your taking the time to let us know what your experience was like.  Warm Regards, Dequante Tremaine M. Sareen Randon, DO   Tobacco Use Disorder Tobacco use disorder (TUD) is a mental disorder. It is the long-term use of tobacco in spite of related health problems or difficulty with normal life activities. Tobacco is most commonly smoked as cigarettes and less commonly as cigars or pipes. Smokeless chewing tobacco and snuff are also popular. People with TUD get a feeling of extreme pleasure (euphoria) from using tobacco and have a desire to use it again and again. Repeated use of tobacco can cause problems. The addictive effects of tobacco are due mainly tothe ingredient nicotine. Nicotine also causes a rush of adrenaline (epinephrine) in the body. This leads to increased blood pressure, heart rate, and breathing rate. These changes may cause problems for people with high blood pressure, weak hearts, or lung disease. High doses of nicotine in children and pets can lead to seizures and death. Tobacco contains a number of other unsafe chemicals. These chemicals are especially harmful when inhaled as smoke and can damage almost every organ in the body. Smokers live shorter lives than nonsmokers and are at risk of dying from a number of diseases and cancers. Tobacco smoke can also cause health problems for nonsmokers (due to inhaling secondhand smoke). Smoking is also a fire hazard. TUD usually starts in the late teenage years and is most common in young adults between the ages of 7418 and 25 years. People who start smoking earlier in life are more likely to continue smoking as adults. TUD is  somewhat more common in men than women. People with TUD are at higher risk for using alcohol and other drugs of abuse. What increases the risk? Risk factors for TUD include:  Having family members with the disorder.  Being around people who use tobacco.  Having an existing mental health issue such as schizophrenia, depression, bipolar disorder, ADHD, or posttraumatic stress disorder (PTSD).  What are the signs or symptoms? People with tobacco use disorder have two or more of the following signs and symptoms within 12 months:  Use of more tobacco over a longer period than intended.  Not able to cut down or control tobacco use.  A lot of time spent obtaining or using tobacco.  Strong desire or urge to use tobacco (craving). Cravings may last for 6 months or longer after quitting.  Use of tobacco even when use leads to major problems at work, school, or home.  Use of tobacco even when use leads to relationship problems.  Giving up or cutting down on important life activities because of tobacco use.  Repeatedly using tobacco in situations where it puts you or others in physical danger, like smoking in bed.  Use of tobacco even when it is known that a physical or mental problem is likely related to tobacco use. ? Physical problems are numerous and may include chronic bronchitis, emphysema, lung and other cancers, gum disease, high blood pressure, heart disease, and stroke. ? Mental problems caused by tobacco may include difficulty sleeping and anxiety.  Need to use greater amounts of  tobacco to get the same effect. This means you have developed a tolerance.  Withdrawal symptoms as a result of stopping or rapidly cutting back use. These symptoms may last a month or more after quitting and include the following: ? Depressed, anxious, or irritable mood. ? Difficulty concentrating. ? Increased appetite. ? Restlessness or trouble sleeping. ? Use of tobacco to avoid withdrawal  symptoms.  How is this diagnosed? Tobacco use disorder is diagnosed by your health care provider. A diagnosis may be made by:  Your health care provider asking questions about your tobacco use and any problems it may be causing.  A physical exam.  Lab tests.  You may be referred to a mental health professional or addiction specialist.  The severity of tobacco use disorder depends on the number of signs and symptoms you have:  Mild-Two or three symptoms.  Moderate-Four or five symptoms.  Severe-Six or more symptoms.  How is this treated? Many people with tobacco use disorder are unable to quit on their own and need help. Treatment options include the following:  Nicotine replacement therapy (NRT). NRT provides nicotine without the other harmful chemicals in tobacco. NRT gradually lowers the dosage of nicotine in the body and reduces withdrawal symptoms. NRT is available in over-the-counter forms (gum, lozenges, and skin patches) as well as prescription forms (mouth inhaler and nasal spray).  Medicines.This may include: ? Antidepressant medicine that may reduce nicotine cravings. ? A medicine that acts on nicotine receptors in the brain to reduce cravings and withdrawal symptoms. It may also block the effects of tobacco in people with TUD who relapse.  Counseling or talk therapy. A form of talk therapy called behavioral therapy is commonly used to treat people with TUD. Behavioral therapy looks at triggers for tobacco use, how to avoid them, and how to cope with cravings. It is most effective in person or by phone but is also available in self-help forms (books and Internet websites).  Support groups. These provide emotional support, advice, and guidance for quitting tobacco.  The most effective treatment for TUD is usually a combination of medicine, talk therapy, and support groups. Follow these instructions at home:  Keep all follow-up visits as directed by your health care  provider. This is important.  Take medicines only as directed by your health care provider.  Check with your health care provider before starting new prescription or over-the-counter medicines. Contact a health care provider if:  You are not able to take your medicines as prescribed.  Treatment is not helping your TUD and your symptoms get worse. Get help right away if:  You have serious thoughts about hurting yourself or others.  You have trouble breathing, chest pain, sudden weakness, or sudden numbness in part of your body. This information is not intended to replace advice given to you by your health care provider. Make sure you discuss any questions you have with your health care provider. Document Released: 03/12/2004 Document Revised: 03/09/2016 Document Reviewed: 09/02/2013 Elsevier Interactive Patient Education  [employer]2018 [employer] Inc[employer]

## 2017-12-29 ENCOUNTER — Ambulatory Visit (INDEPENDENT_AMBULATORY_CARE_PROVIDER_SITE_OTHER): Payer: Self-pay | Admitting: Family Medicine

## 2017-12-29 ENCOUNTER — Encounter: Payer: Self-pay | Admitting: Family Medicine

## 2017-12-29 VITALS — BP 124/86 | HR 88 | Temp 97.9°F | Ht 65.0 in | Wt 187.0 lb

## 2017-12-29 DIAGNOSIS — I10 Essential (primary) hypertension: Secondary | ICD-10-CM

## 2017-12-29 DIAGNOSIS — M545 Low back pain: Secondary | ICD-10-CM

## 2017-12-29 DIAGNOSIS — M542 Cervicalgia: Secondary | ICD-10-CM

## 2017-12-29 DIAGNOSIS — G8929 Other chronic pain: Secondary | ICD-10-CM

## 2017-12-29 MED ORDER — CYCLOBENZAPRINE HCL 10 MG PO TABS: 10.0000 mg | ORAL_TABLET | Freq: Every day | ORAL | 3 refills | Status: DC | PRN

## 2017-12-29 MED ORDER — GABAPENTIN 300 MG PO CAPS: ORAL_CAPSULE | ORAL | 3 refills | Status: DC

## 2017-12-29 MED ORDER — HYDROCHLOROTHIAZIDE 25 MG PO TABS: 25.0000 mg | ORAL_TABLET | Freq: Every day | ORAL | 3 refills | Status: DC

## 2017-12-29 MED ORDER — CARVEDILOL 25 MG PO TABS: 25.0000 mg | ORAL_TABLET | Freq: Two times a day (BID) | ORAL | 3 refills | Status: DC

## 2017-12-29 NOTE — Patient Instructions (Signed)
I have refilled your medications.  I have placed orders for labs.  Work on the [employer] financial assistance application and then come in and have labs done once you are able.  The financial assistance application should also help with your visits to Dr. Rayna SextonFerguson's office.  I do want you keeping up with your Pap smears since you have had abnormal ones in the past.

## 2017-12-29 NOTE — Progress Notes (Signed)
 Subjective: CC: med refills PCP: Miller, Stephen M, MD HPI:Kathy Bradley is a 37 y.o. female presenting to clinic today for:  1.  Chronic right-sided low back pain Patient with history of surgical intervention in the right side of her back.  She describes pain that is worse in the morning in the evenings.  There is no radiation down the right lower extremity but does seem to radiate into the hip.  She notes occasional numbness and tingling in that side as well.  Denies any weakness, falls.  She reports use of gabapentin and Flexeril for symptoms, with good relief.  She notes that symptoms do seem to flare with moderate amounts of activity but she is able to do things like cleaning her house.  She does need to take breaks sometimes because she becomes uncomfortable.  2.  Hypertension Patient reports compliance with the Coreg and hydrochlorothiazide.  She reports good tolerance of both medications.  Denies any chest pains, shortness of breath, lower extremity edema, dizziness.  She has been swimming to try and keep active, as this does not aggravate her back as much.  She also has been watching her salt.  She does not monitor blood pressures at home.  3.  History of abnormal Pap smear Patient reports that she has been followed by Dr. Ferguson for L SIL.  She is due to see him back next year but is somewhat worried because she is now uninsured.  No abnormal vaginal discharge or symptoms at this time.   ROS: Per HPI  Allergies  Allergen Reactions  . Penicillins Hives    No hx of anaphylaxis   Past Medical History:  Diagnosis Date  . Abnormal Pap smear of cervix   . Hypertension     Current Outpatient Medications:  .  carvedilol (COREG) 25 MG tablet, Take 1 tablet (25 mg total) by mouth 2 (two) times daily with a meal., Disp: 180 tablet, Rfl: 1 .  cyclobenzaprine (FLEXERIL) 10 MG tablet, Take 1 tablet (10 mg total) by mouth daily as needed., Disp: 90 tablet, Rfl: 1 .  fexofenadine  (ALLEGRA) 180 MG tablet, Take 180 mg by mouth daily., Disp: , Rfl:  .  gabapentin (NEURONTIN) 300 MG capsule, TAKE 1 CAPSULE FOUR TIMES A DAY, Disp: 360 capsule, Rfl: 1 .  hydrochlorothiazide (HYDRODIURIL) 25 MG tablet, Take 1 tablet (25 mg total) by mouth daily., Disp: 90 tablet, Rfl: 1 .  levonorgestrel (MIRENA) 20 MCG/24HR IUD, 1 each by Intrauterine route once., Disp: , Rfl:  Social History   Socioeconomic History  . Marital status: Divorced    Spouse name: Not on file  . Number of children: 1  . Years of education: Not on file  . Highest education level: Not on file  Occupational History  . Occupation: unemployed  Social Needs  . Financial resource strain: Not on file  . Food insecurity:    Worry: Not on file    Inability: Not on file  . Transportation needs:    Medical: Not on file    Non-medical: Not on file  Tobacco Use  . Smoking status: Current Every Day Smoker    Packs/day: 1.00    Years: 15.00    Pack years: 15.00    Types: Cigarettes    Last attempt to quit: 11/18/2013    Years since quitting: 4.1  . Smokeless tobacco: Never Used  Substance and Sexual Activity  . Alcohol use: Yes    Comment: occ.  . Drug use:   No  . Sexual activity: Yes    Birth control/protection: IUD  Lifestyle  . Physical activity:    Days per week: Not on file    Minutes per session: Not on file  . Stress: Not on file  Relationships  . Social connections:    Talks on phone: Not on file    Gets together: Not on file    Attends religious service: Not on file    Active member of club or organization: Not on file    Attends meetings of clubs or organizations: Not on file    Relationship status: Not on file  . Intimate partner violence:    Fear of current or ex partner: Not on file    Emotionally abused: Not on file    Physically abused: Not on file    Forced sexual activity: Not on file  Other Topics Concern  . Not on file  Social History Narrative  . Not on file   Family History    Problem Relation Age of Onset  . Hypertension Mother   . Hypertension Father   . Bipolar disorder Brother   . Drug abuse Brother   . Hypertension Maternal Grandmother   . Hypertension Maternal Grandfather   . Cancer Paternal Aunt        cervical  . Cancer Paternal Grandmother        ovarian    Objective: Office vital signs reviewed. BP 124/86   Pulse 88   Temp 97.9 F (36.6 C) (Oral)   Ht 5' 5" (1.651 m)   Wt 187 lb (84.8 kg)   BMI 31.12 kg/m   Physical Examination:  General: Awake, alert, well nourished, No acute distress Cardio: regular rate and rhythm, S1S2 heard, no murmurs appreciated Pulm: clear to auscultation bilaterally, no wheezes, rhonchi or rales; normal work of breathing on room air Extremities: warm, well perfused, No edema, cyanosis or clubbing; +2 pulses bilaterally MSK: normal gait and normal station; ambulates independently  Lumbar spine: Flattening of the lumbar spine appreciated.  There is no midline tenderness to palpation.  There is right-sided paraspinal muscle tenderness to palpation, particularly near the lumbosacral and sacroiliac junctions. Skin: dry; intact; no rashes or lesions Neuro: Light touch sensation grossly intact  Assessment/ Plan: 37 y.o. female   1. Essential hypertension Blood pressure at goal on current regimen.  I have refilled this through the next year.  I did recommend that she ask for the [employer] financial assistance paperwork.  I would like her to have metabolic labs performed yearly to monitor renal function and electrolytes on diuretic.  I have placed future orders for CMP and a lipid panel.  Patient will come in fasting in the next several months to have these done. - hydrochlorothiazide (HYDRODIURIL) 25 MG tablet; Take 1 tablet (25 mg total) by mouth daily.  Dispense: 90 tablet; Refill: 3 - CMP14+EGFR; Future - Lipid Panel; Future  2. Chronic bilateral low back pain without sciatica Refill of gabapentin and Flexeril  prescribed.  3. Neck pain As above.   Orders Placed This Encounter  Procedures  . CMP14+EGFR    Standing Status:   Future    Standing Expiration Date:   12/30/2018  . Lipid Panel    Standing Status:   Future    Standing Expiration Date:   12/30/2018   Meds ordered this encounter  Medications  . carvedilol (COREG) 25 MG tablet    Sig: Take 1 tablet (25 mg total) by mouth 2 (two) times  daily with a meal.    Dispense:  180 tablet    Refill:  3    Needs to be seen before next refill  . cyclobenzaprine (FLEXERIL) 10 MG tablet    Sig: Take 1 tablet (10 mg total) by mouth daily as needed.    Dispense:  90 tablet    Refill:  3  . gabapentin (NEURONTIN) 300 MG capsule    Sig: TAKE 1 CAPSULE FOUR TIMES A DAY    Dispense:  360 capsule    Refill:  3  . hydrochlorothiazide (HYDRODIURIL) 25 MG tablet    Sig: Take 1 tablet (25 mg total) by mouth daily.    Dispense:  90 tablet    Refill:  3      M , DO [facility] [phone]   
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
## 2018-05-12 ENCOUNTER — Encounter: Payer: Self-pay | Admitting: Family Medicine

## 2018-05-12 ENCOUNTER — Ambulatory Visit: Payer: Self-pay | Admitting: Family Medicine

## 2018-05-12 VITALS — BP 118/84 | HR 98 | Temp 98.4°F | Ht 65.0 in | Wt 182.0 lb

## 2018-05-12 DIAGNOSIS — M25562 Pain in left knee: Secondary | ICD-10-CM

## 2018-05-12 DIAGNOSIS — M25561 Pain in right knee: Secondary | ICD-10-CM

## 2018-05-12 MED ORDER — DICLOFENAC SODIUM 75 MG PO TBEC: 75.0000 mg | DELAYED_RELEASE_TABLET | Freq: Two times a day (BID) | ORAL | 2 refills | Status: DC

## 2018-05-12 NOTE — Patient Instructions (Signed)
I have sent you in diclofenac to take twice a day if needed for pain and swelling.  For the next 7 days I would like you to take it consistently every day.  After that, you may use it as needed as directed.  I would like you to make sure that you take a medicine for stomach acid on the days that you take this medicine.  If symptoms worsen or do not respond to the medication, call me and I will put a referral into the orthopedist.  You have prescribed a nonsteroidal anti-inflammatory drug (NSAID) today. This will help with your pain and inflammation. Please do not take any other NSAIDs (ibuprofen/Motrin/Advil, naproxen/Aleve, meloxicam/Mobic, Voltaren/diclofenac). Please make sure to eat a meal when taking this medication.   Caution:  If you have a history of acid reflux/indigestion, I recommend that you take an antacid (such as Prilosec, Prevacid) daily while on the NSAID.  If you have a history of bleeding disorder, gastric ulcer, are on a blood thinner (like warfarin/Coumadin, Xarelto, Eliquis, etc) please do not take NSAID.  If you have ever had a heart attack, you should not take NSAIDs.   Patellofemoral Pain Syndrome Patellofemoral pain syndrome is a condition that involves a softening or breakdown of the tissue (cartilage) on the underside of your kneecap (patella). This causes pain in the front of the knee. The condition is also called runner's knee or chondromalacia patella. Patellofemoral pain syndrome is most common in young adults who are active in sports. Your knee is the largest joint in your body. The patella covers the front of your knee and is attached to muscles above and below your knee. The underside of the patella is covered with a smooth type of cartilage (synovium). The smooth surface helps the patella glide easily when you move your knee. Patellofemoral pain syndrome causes swelling in the joint linings and bone surfaces in your knee. What are the causes? Patellofemoral pain  syndrome can be caused by:  Overuse.  Poor alignment of your knee joints.  Weak leg muscles.  A direct blow to your kneecap.  What increases the risk? You may be at risk for patellofemoral pain syndrome if you:  Do a lot of activities that can wear down your kneecap. These include: ? Running. ? Squatting. ? Climbing stairs.  Start a new physical activity or exercise program.  Wear shoes that do not fit well.  Do not have good leg strength.  Are overweight.  What are the signs or symptoms? Knee pain is the most common symptom of patellofemoral pain syndrome. This may feel like a dull, aching pain underneath your patella, in the front of your knee. There may be a popping or cracking sound when you move your knee. Pain may get worse with:  Exercise.  Climbing stairs.  Running.  Jumping.  Squatting.  Kneeling.  Sitting for a long time.  Moving or pushing on your patella.  How is this diagnosed? Your health care provider may be able to diagnose patellofemoral pain syndrome from your symptoms and medical history. You may be asked about your recent physical activities and which ones cause knee pain. Your health care provider may do a physical exam with certain tests to confirm the diagnosis. These may include:  Moving your patella back and forth.  Checking your range of knee motion.  Having you squat or jump to see if you have pain.  Checking the strength of your leg muscles.  An MRI of the knee  may also be done. How is this treated? Patellofemoral pain syndrome can usually be treated at home with rest, ice, compression, and elevation (RICE). Other treatments may include:  Nonsteroidal anti-inflammatory drugs (NSAIDs).  Physical therapy to stretch and strengthen your leg muscles.  Shoe inserts (orthotics) to take stress off your knee.  A knee brace or knee support.  Surgery to remove damaged cartilage or move the patella to a better position. The need for  surgery is rare.  Follow these instructions at home:  Take medicines only as directed by your health care provider.  Rest your knee. ? When resting, keep your knee raised above the level of your heart. ? Avoid activities that cause knee pain.  Apply ice to the injured area: ? Put ice in a plastic bag. ? Place a towel between your skin and the bag. ? Leave the ice on for 20 minutes, 2-3 times a day.  Use splints, braces, knee supports, or walking aids as directed by your health care provider.  Perform stretching and strengthening exercises as directed by your health care provider or physical therapist.  Keep all follow-up visits as directed by your health care provider. This is important. Contact a health care provider if:  Your symptoms get worse.  You are not improving with home care. This information is not intended to replace advice given to you by your health care provider. Make sure you discuss any questions you have with your health care provider. Document Released: 06/25/2009 Document Revised: 12/13/2015 Document Reviewed: 09/26/2013 Elsevier Interactive Patient Education  2018 [employer].

## 2018-05-12 NOTE — Progress Notes (Signed)
Subjective: CC: Bilateral knee pain PCP: Raliegh Ip, DO ZOX:WRUEAVW D Ryback is a 37 y.o. female presenting to clinic today for:  1. Bilateral knee pain Patient reports onset of bilateral knee pain and swelling about a month and a half ago.  She notes that this started when she started helping her husband out with some lawn care.  She has been frequently ambulating up and down hills and finds that symptoms seem to be worse after doing this.  She notes that the swelling and pain gets particularly bad later in the evening such that she cannot bend her knees.  She also reports some right-sided low back pain that radiates down into the hip and right lower extremity.  She has not taken any medications or done any therapies for this.  Past medical history is significant for back surgery last year.   ROS: Per HPI  Allergies  Allergen Reactions  . Penicillins Hives    No hx of anaphylaxis   Past Medical History:  Diagnosis Date  . Abnormal Pap smear of cervix   . Hypertension     Current Outpatient Medications:  .  carvedilol (COREG) 25 MG tablet, Take 1 tablet (25 mg total) by mouth 2 (two) times daily with a meal., Disp: 180 tablet, Rfl: 3 .  cyclobenzaprine (FLEXERIL) 10 MG tablet, Take 1 tablet (10 mg total) by mouth daily as needed., Disp: 90 tablet, Rfl: 3 .  fexofenadine (ALLEGRA) 180 MG tablet, Take 180 mg by mouth daily., Disp: , Rfl:  .  gabapentin (NEURONTIN) 300 MG capsule, TAKE 1 CAPSULE FOUR TIMES A DAY, Disp: 360 capsule, Rfl: 3 .  hydrochlorothiazide (HYDRODIURIL) 25 MG tablet, Take 1 tablet (25 mg total) by mouth daily., Disp: 90 tablet, Rfl: 3 .  levonorgestrel (MIRENA) 20 MCG/24HR IUD, 1 each by Intrauterine route once., Disp: , Rfl:  Social History   Socioeconomic History  . Marital status: Divorced    Spouse name: Not on file  . Number of children: 1  . Years of education: Not on file  . Highest education level: Not on file  Occupational History  .  Occupation: unemployed  Social Needs  . Financial resource strain: Not on file  . Food insecurity:    Worry: Not on file    Inability: Not on file  . Transportation needs:    Medical: Not on file    Non-medical: Not on file  Tobacco Use  . Smoking status: Current Every Day Smoker    Packs/day: 1.00    Years: 15.00    Pack years: 15.00    Types: Cigarettes    Last attempt to quit: 11/18/2013    Years since quitting: 4.4  . Smokeless tobacco: Never Used  Substance and Sexual Activity  . Alcohol use: Yes    Comment: occ.  . Drug use: No  . Sexual activity: Yes    Birth control/protection: IUD  Lifestyle  . Physical activity:    Days per week: Not on file    Minutes per session: Not on file  . Stress: Not on file  Relationships  . Social connections:    Talks on phone: Not on file    Gets together: Not on file    Attends religious service: Not on file    Active member of club or organization: Not on file    Attends meetings of clubs or organizations: Not on file    Relationship status: Not on file  . Intimate partner violence:  Fear of current or ex partner: Not on file    Emotionally abused: Not on file    Physically abused: Not on file    Forced sexual activity: Not on file  Other Topics Concern  . Not on file  Social History Narrative  . Not on file   Family History  Problem Relation Age of Onset  . Hypertension Mother   . Hypertension Father   . Bipolar disorder Brother   . Drug abuse Brother   . Hypertension Maternal Grandmother   . Hypertension Maternal Grandfather   . Cancer Paternal Aunt        cervical  . Cancer Paternal Grandmother        ovarian    Objective: Office vital signs reviewed. BP 118/84   Pulse 98   Temp 98.4 F (36.9 C) (Oral)   Ht 5\' 5"  (1.651 m)   Wt 182 lb (82.6 kg)   BMI 30.29 kg/m   Physical Examination:  General: Awake, alert, well nourished, No acute distress MSK: normal gait and station  Knees: Patient has full  active range of motion bilaterally.  No gross joint effusion or soft tissue swelling noted on exam.  No tenderness to palpation to the patella, patellar tendon or quad tendon.  No palpable posterior popliteal masses.  No ligamentous laxity. Neuro: Lower extreme light touch sensation grossly intact.  Assessment/ Plan: 37 y.o. female   1. Acute bilateral knee pain Physical exam was fairly unremarkable but again, patient's symptoms seem to be more prominent after activity and later in the evening.  I suspect that this may be meniscal irritation versus patellofemoral syndrome given relation to increased physical activity and ambulation up and down hills.  Start Voltaren p.o. twice daily as needed pain.  I instructed her to use this consistently for the next week and then use as needed as directed.  We discussed use of PPI or over-the-counter antacid given possible prolonged use of NSAID.  Avoid other NSAIDs.  Hydrate.  She will contact me if symptoms worsen or do not improve.  At that point, should consider referral to orthopedist for further evaluation.  Meds ordered this encounter  Medications  . diclofenac (VOLTAREN) 75 MG EC tablet    Sig: Take 1 tablet (75 mg total) by mouth 2 (two) times daily.    Dispense:  60 tablet    Refill:  2     Tomeko Scoville Hulen Skains, DO [facility] [phone]

## 2018-08-05 ENCOUNTER — Other Ambulatory Visit: Payer: Self-pay | Admitting: Family Medicine

## 2018-08-30 ENCOUNTER — Other Ambulatory Visit: Payer: Self-pay | Admitting: Family Medicine

## 2018-09-08 ENCOUNTER — Ambulatory Visit (INDEPENDENT_AMBULATORY_CARE_PROVIDER_SITE_OTHER): Payer: Medicaid Other | Admitting: Obstetrics and Gynecology

## 2018-09-08 ENCOUNTER — Encounter: Payer: Self-pay | Admitting: Obstetrics and Gynecology

## 2018-09-08 ENCOUNTER — Other Ambulatory Visit: Payer: Self-pay

## 2018-09-08 ENCOUNTER — Other Ambulatory Visit (HOSPITAL_COMMUNITY)
Admission: RE | Admit: 2018-09-08 | Discharge: 2018-09-08 | Disposition: A | Discharge status: Home / Self Care | Payer: Medicaid Other | Source: Ambulatory Visit | Attending: Obstetrics and Gynecology | Admitting: Obstetrics and Gynecology

## 2018-09-08 VITALS — BP 141/91 | HR 83 | Ht 64.0 in | Wt 188.0 lb

## 2018-09-08 DIAGNOSIS — Z975 Presence of (intrauterine) contraceptive device: Secondary | ICD-10-CM | POA: Diagnosis not present

## 2018-09-08 DIAGNOSIS — Z309 Encounter for contraceptive management, unspecified: Secondary | ICD-10-CM | POA: Diagnosis not present

## 2018-09-08 DIAGNOSIS — Z3009 Encounter for other general counseling and advice on contraception: Secondary | ICD-10-CM | POA: Insufficient documentation

## 2018-09-08 NOTE — Progress Notes (Signed)
Kathy Bradley is a 38 y.o. G49P1011 female here for a routine annual gynecologic exam.    Denies abnormal vaginal bleeding, discharge, pelvic pain, problems with intercourse or other gynecologic concerns.    Gynecologic History No LMP recorded. (Menstrual status: IUD). Contraception: IUD Last Pap: 2016. Results were: abnormal Last mammogram: NA. Results were: NA  Obstetric History OB History  Gravida Para Term Preterm AB Living  2 1 1   1 1   SAB TAB Ectopic Multiple Live Births  1            # Outcome Date GA Lbr Len/2nd Weight Sex Delivery Anes PTL Lv  2 SAB           1 Term             Past Medical History:  Diagnosis Date  . Abnormal Pap smear of cervix   . Hypertension     Past Surgical History:  Procedure Laterality Date  . CERVICAL CONIZATION W/BX N/A 08/22/2014   Procedure: CONIZATION CERVIX WITH BIOPSY;  Surgeon: Tilda Burrow, MD;  Location: [facility];  Service: Gynecology;  Laterality: N/A;  . COLPOSCOPY VULVA W/ BIOPSY    . DILATION AND CURETTAGE OF UTERUS    . HOLMIUM LASER APPLICATION N/A 08/22/2014   Procedure: HOLMIUM LASER APPLICATION;  Surgeon: Tilda Burrow, MD;  Location: [facility];  Service: Gynecology;  Laterality: N/A;  . LUMBAR LAMINECTOMY  2018    Current Outpatient Medications on File Prior to Visit  Medication Sig Dispense Refill  . carvedilol (COREG) 25 MG tablet Take 1 tablet (25 mg total) by mouth 2 (two) times daily with a meal. 180 tablet 3  . cyclobenzaprine (FLEXERIL) 10 MG tablet Take 1 tablet (10 mg total) by mouth daily as needed. 90 tablet 3  . diclofenac (VOLTAREN) 75 MG EC tablet TAKE 1 TABLET BY MOUTH TWICE A DAY 60 tablet 1  . fexofenadine (ALLEGRA) 180 MG tablet Take 180 mg by mouth daily.    Marland Kitchen gabapentin (NEURONTIN) 300 MG capsule TAKE 1 CAPSULE FOUR TIMES A DAY 360 capsule 3  . hydrochlorothiazide (HYDRODIURIL) 25 MG tablet Take 1 tablet (25 mg total) by mouth daily. 90 tablet 3  . levonorgestrel (MIRENA) 20 MCG/24HR IUD 1 each by  Intrauterine route once.     No current facility-administered medications on file prior to visit.     Allergies  Allergen Reactions  . Penicillins Hives    No hx of anaphylaxis    Social History   Socioeconomic History  . Marital status: Divorced    Spouse name: Not on file  . Number of children: 1  . Years of education: Not on file  . Highest education level: Not on file  Occupational History  . Occupation: unemployed  Social Needs  . Financial resource strain: Not on file  . Food insecurity:    Worry: Not on file    Inability: Not on file  . Transportation needs:    Medical: Not on file    Non-medical: Not on file  Tobacco Use  . Smoking status: Current Every Day Smoker    Packs/day: 1.00    Years: 15.00    Pack years: 15.00    Types: Cigarettes    Last attempt to quit: 11/18/2013    Years since quitting: 4.8  . Smokeless tobacco: Never Used  Substance and Sexual Activity  . Alcohol use: Yes    Comment: occ.  . Drug use: No  . Sexual activity:  Yes    Birth control/protection: I.U.D.  Lifestyle  . Physical activity:    Days per week: Not on file    Minutes per session: Not on file  . Stress: Not on file  Relationships  . Social connections:    Talks on phone: Not on file    Gets together: Not on file    Attends religious service: Not on file    Active member of club or organization: Not on file    Attends meetings of clubs or organizations: Not on file    Relationship status: Not on file  . Intimate partner violence:    Fear of current or ex partner: Not on file    Emotionally abused: Not on file    Physically abused: Not on file    Forced sexual activity: Not on file  Other Topics Concern  . Not on file  Social History Narrative  . Not on file    Family History  Problem Relation Age of Onset  . Hypertension Mother   . Hypertension Father   . Bipolar disorder Brother   . Drug abuse Brother   . Hypertension Maternal Grandmother   . Hypertension  Maternal Grandfather   . Cancer Paternal Aunt        cervical  . Cancer Paternal Grandmother        ovarian    The following portions of the patient's history were reviewed and updated as appropriate: allergies, current medications, past family history, past medical history, past social history, past surgical history and problem list.  Review of Systems Pertinent items noted in HPI and remainder of comprehensive ROS otherwise negative.   Objective:  BP (!) 141/91 (BP Location: Right Arm, Patient Position: Sitting, Cuff Size: Normal)   Pulse 83   Ht 5\' 4"  (1.626 m)   Wt 188 lb (85.3 kg)   BMI 32.27 kg/m  CONSTITUTIONAL: Well-developed, well-nourished female in no acute distress.  HENT:  Normocephalic, atraumatic, External right and left ear normal. Oropharynx is clear and moist EYES: Conjunctivae and EOM are normal. Pupils are equal, round, and reactive to light. No scleral icterus.  NECK: Normal range of motion, supple, no masses.  Normal thyroid.  SKIN: Skin is warm and dry. No rash noted. Not diaphoretic. No erythema. No pallor. NEUROLGIC: Alert and oriented to person, place, and time. Normal reflexes, muscle tone coordination. No cranial nerve deficit noted. PSYCHIATRIC: Normal mood and affect. Normal behavior. Normal judgment and thought content. CARDIOVASCULAR: Normal heart rate noted, regular rhythm RESPIRATORY: Clear to auscultation bilaterally. Effort and breath sounds normal, no problems with respiration noted. BREASTS: Symmetric in size. No masses, skin changes, nipple drainage, or lymphadenopathy. ABDOMEN: Soft, normal bowel sounds, no distention noted.  No tenderness, rebound or guarding.  PELVIC: Normal appearing external genitalia; normal appearing vaginal mucosa and cervix.  No abnormal discharge noted.  Pap smear obtained.  Normal uterine size, no other palpable masses, no uterine or adnexal tenderness. IUD strings noted MUSCULOSKELETAL: Normal range of motion. No  tenderness.  No cyanosis, clubbing, or edema.  2+ distal pulses.   Assessment:  Annual gynecologic examination with pap smear IUD check Plan:  Will follow up results of pap smear and manage accordingly. Return in July for IUD removal Routine preventative health maintenance measures emphasized. Please refer to After Visit Summary for other counseling recommendations.    Hermina Staggers, MD, FACOG Attending Obstetrician & Gynecologist Center for [facility], [facility]

## 2018-09-08 NOTE — Patient Instructions (Signed)

## 2018-09-09 LAB — HIV ANTIBODY (ROUTINE TESTING W REFLEX): HIV Screen 4th Generation wRfx: NONREACTIVE

## 2018-09-09 LAB — RPR: RPR Ser Ql: NONREACTIVE

## 2018-09-14 LAB — CYTOLOGY - PAP
Chlamydia: NEGATIVE
Diagnosis: NEGATIVE
HPV: NOT DETECTED
Neisseria Gonorrhea: NEGATIVE

## 2018-09-21 ENCOUNTER — Telehealth: Payer: Self-pay | Admitting: Obstetrics and Gynecology

## 2018-09-21 NOTE — Telephone Encounter (Signed)
Pt called requesting results. DOB verified. Informed pt that PAP, HIV, and RPR were all normal. Pt verbalized understanding.

## 2018-09-21 NOTE — Telephone Encounter (Signed)
Patient called requesting pap results.  [phone]

## 2018-12-06 ENCOUNTER — Ambulatory Visit (INDEPENDENT_AMBULATORY_CARE_PROVIDER_SITE_OTHER): Payer: Medicaid Other | Admitting: Family Medicine

## 2018-12-06 ENCOUNTER — Other Ambulatory Visit: Payer: Self-pay

## 2018-12-06 DIAGNOSIS — M542 Cervicalgia: Secondary | ICD-10-CM

## 2018-12-06 DIAGNOSIS — M25562 Pain in left knee: Secondary | ICD-10-CM

## 2018-12-06 DIAGNOSIS — I10 Essential (primary) hypertension: Secondary | ICD-10-CM

## 2018-12-06 DIAGNOSIS — G8929 Other chronic pain: Secondary | ICD-10-CM

## 2018-12-06 MED ORDER — HYDROCHLOROTHIAZIDE 25 MG PO TABS: 25.0000 mg | ORAL_TABLET | Freq: Every day | ORAL | 0 refills | Status: DC

## 2018-12-06 MED ORDER — GABAPENTIN 300 MG PO CAPS: 300.0000 mg | ORAL_CAPSULE | Freq: Four times a day (QID) | ORAL | 0 refills | Status: DC

## 2018-12-06 MED ORDER — CYCLOBENZAPRINE HCL 10 MG PO TABS: 10.0000 mg | ORAL_TABLET | Freq: Every day | ORAL | 0 refills | Status: DC | PRN

## 2018-12-06 MED ORDER — DICLOFENAC SODIUM 75 MG PO TBEC: 75.0000 mg | DELAYED_RELEASE_TABLET | Freq: Two times a day (BID) | ORAL | 1 refills | Status: DC

## 2018-12-06 MED ORDER — CARVEDILOL 25 MG PO TABS: 25.0000 mg | ORAL_TABLET | Freq: Two times a day (BID) | ORAL | 0 refills | Status: DC
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
## 2019-02-08 ENCOUNTER — Ambulatory Visit: Payer: Self-pay | Admitting: Family Medicine

## 2019-02-21 ENCOUNTER — Other Ambulatory Visit: Payer: Self-pay

## 2019-02-22 ENCOUNTER — Ambulatory Visit (INDEPENDENT_AMBULATORY_CARE_PROVIDER_SITE_OTHER): Payer: Self-pay | Admitting: Family Medicine

## 2019-02-22 ENCOUNTER — Encounter: Payer: Self-pay | Admitting: Family Medicine

## 2019-02-22 VITALS — BP 125/85 | HR 75 | Temp 98.6°F | Ht 64.0 in | Wt 185.0 lb

## 2019-02-22 DIAGNOSIS — M542 Cervicalgia: Secondary | ICD-10-CM

## 2019-02-22 DIAGNOSIS — M545 Low back pain: Secondary | ICD-10-CM

## 2019-02-22 DIAGNOSIS — I10 Essential (primary) hypertension: Secondary | ICD-10-CM

## 2019-02-22 DIAGNOSIS — Z720 Tobacco use: Secondary | ICD-10-CM

## 2019-02-22 DIAGNOSIS — G8929 Other chronic pain: Secondary | ICD-10-CM

## 2019-02-22 DIAGNOSIS — M25562 Pain in left knee: Secondary | ICD-10-CM

## 2019-02-22 MED ORDER — HYDROCHLOROTHIAZIDE 25 MG PO TABS: 25.0000 mg | ORAL_TABLET | Freq: Every day | ORAL | 3 refills | Status: DC

## 2019-02-22 MED ORDER — CYCLOBENZAPRINE HCL 10 MG PO TABS: 10.0000 mg | ORAL_TABLET | Freq: Every day | ORAL | 0 refills | Status: DC | PRN

## 2019-02-22 MED ORDER — DICLOFENAC SODIUM 75 MG PO TBEC: 75.0000 mg | DELAYED_RELEASE_TABLET | Freq: Two times a day (BID) | ORAL | 1 refills | Status: DC

## 2019-02-22 MED ORDER — GABAPENTIN 300 MG PO CAPS: 300.0000 mg | ORAL_CAPSULE | Freq: Four times a day (QID) | ORAL | 2 refills | Status: DC

## 2019-02-22 MED ORDER — CARVEDILOL 25 MG PO TABS: 25.0000 mg | ORAL_TABLET | Freq: Two times a day (BID) | ORAL | 3 refills | Status: DC

## 2019-02-22 NOTE — Patient Instructions (Signed)

## 2019-02-22 NOTE — Progress Notes (Signed)
Subjective: CC: F/u HTN, chronic back pain PCP: Janora Norlander, DO AST:Kathy Bradley is a 38 y.o. female presenting to clinic today for:  1.  Hypertension Patient reports compliance with both her hydrochlorothiazide 25 mg daily and carvedilol 25 mg twice daily.  No chest pain, shortness of breath, dizziness or lower extremity edema.  She works outside and does try to hydrate adequately given use of diuretic.  2.  Chronic neck and back pain Patient reports stability on gabapentin, Flexeril and PRN use of the diclofenac.  She tries to avoid use of diclofenac when she can because she does not want to elevate her blood pressures.  Denies excessive sedation with gabapentin and Flexeril use.  No red flag signs or symptoms including saddle anesthesia, fecal incontinence or urinary retention.  Pertaining to the left knee, she still has occasional swelling and pain but more often than not symptoms are well controlled.  Pain tends to be posterior to the knee.  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 Penicillins Hives    No hx of anaphylaxis   Past Medical History:  Diagnosis Date  . Abnormal Pap smear of cervix   . Hypertension     Current Outpatient Medications:  .  carvedilol (COREG) 25 MG tablet, Take 1 tablet (25 mg total) by mouth 2 (two) times daily with a meal., Disp: 180 tablet, Rfl: 3 .  cyclobenzaprine (FLEXERIL) 10 MG tablet, Take 1 tablet (10 mg total) by mouth daily as needed., Disp: 90 tablet, Rfl: 0 .  diclofenac (VOLTAREN) 75 MG EC tablet, Take 1 tablet (75 mg total) by mouth 2 (two) times daily., Disp: 60 tablet, Rfl: 1 .  fexofenadine (ALLEGRA) 180 MG tablet, Take 180 mg by mouth daily., Disp: , Rfl:  .  gabapentin (NEURONTIN) 300 MG capsule, Take 1 capsule (300 mg total) by mouth 4 (four) times daily., Disp: 360 capsule, Rfl: 2 .   hydrochlorothiazide (HYDRODIURIL) 25 MG tablet, Take 1 tablet (25 mg total) by mouth daily., Disp: 90 tablet, Rfl: 3 .  levonorgestrel (MIRENA) 20 MCG/24HR IUD, 1 each by Intrauterine route once., Disp: , Rfl:  Social History   Socioeconomic History  . Marital status: Divorced    Spouse name: Not on file  . Number of children: 1  . Years of education: Not on file  . Highest education level: Not on file  Occupational History  . Occupation: unemployed  Social Needs  . Financial resource strain: Not on file  . Food insecurity    Worry: Not on file    Inability: Not on file  . Transportation needs    Medical: Not on file    Non-medical: Not on file  Tobacco Use  . Smoking status: Current Every Day Smoker    Packs/day: 1.00    Years: 15.00    Pack years: 15.00    Types: Cigarettes    Last attempt to quit: 11/18/2013    Years since quitting: 5.2  . Smokeless tobacco: Never Used  Substance and Sexual Activity  . Alcohol use: Yes    Comment: occ.  . Drug use: No  . Sexual activity: Yes    Birth control/protection: I.U.D.  Lifestyle  . Physical activity    Days per week: Not on file    Minutes per session: Not on file  .  Stress: Not on file  Relationships  . Social Herbalist on phone: Not on file    Gets together: Not on file    Attends religious service: Not on file    Active member of club or organization: Not on file    Attends meetings of clubs or organizations: Not on file    Relationship status: Not on file  . Intimate partner violence    Fear of current or ex partner: Not on file    Emotionally abused: Not on file    Physically abused: Not on file    Forced sexual activity: Not on file  Other Topics Concern  . Not on file  Social History Narrative  . Not on file   Family History  Problem Relation Age of Onset  . Hypertension Mother   . Hypertension Father   . Bipolar disorder Brother   . Drug abuse Brother   . Hypertension Maternal Grandmother    . Hypertension Maternal Grandfather   . Cancer Paternal Aunt        cervical  . Cancer Paternal Grandmother        ovarian    Objective: Office vital signs reviewed. BP 125/85   Pulse 75   Temp 98.6 F (37 C) (Temporal)   Ht '5\' 4"'$  (1.626 m)   Wt 185 lb (83.9 kg)   BMI 31.76 kg/m   Physical Examination:  General: Awake, alert, well nourished, No acute distress HEENT: Normal, sclera white, MMM Cardio: regular rate and rhythm, S1S2 heard, no murmurs appreciated Pulm: clear to auscultation bilaterally, no wheezes, rhonchi or rales; normal work of breathing on room air Extremities: warm, well perfused, No edema, cyanosis or clubbing; +2 pulses bilaterally MSK: normal gait and station  Left knee: Minimal joint effusion noted laterally.  No ligamentous laxity, palpable bony abnormalities.  Range of motion is fairly preserved  Assessment/ Plan: 38 y.o. female   1. Essential hypertension Controlled.  Continue current regimen.  Check metabolic panel - TDD22+GURK - carvedilol (COREG) 25 MG tablet; Take 1 tablet (25 mg total) by mouth 2 (two) times daily with a meal.  Dispense: 180 tablet; Refill: 3 - hydrochlorothiazide (HYDRODIURIL) 25 MG tablet; Take 1 tablet (25 mg total) by mouth daily.  Dispense: 90 tablet; Refill: 3  2. Chronic bilateral low back pain without sciatica Stable.  Continue as needed use of Flexeril, Voltaren and daily use of Neurontin  3. Tobacco use Contemplative  4. Neck pain As above - cyclobenzaprine (FLEXERIL) 10 MG tablet; Take 1 tablet (10 mg total) by mouth daily as needed.  Dispense: 90 tablet; Refill: 0 - gabapentin (NEURONTIN) 300 MG capsule; Take 1 capsule (300 mg total) by mouth 4 (four) times daily.  Dispense: 360 capsule; Refill: 2 - diclofenac (VOLTAREN) 75 MG EC tablet; Take 1 tablet (75 mg total) by mouth 2 (two) times daily.  Dispense: 60 tablet; Refill: 1  5. Chronic pain of left knee Stable.  Does not require corticosteroid injection  today though I did offer her this if needed going forward.  Recommended use of brace, particularly if performing activities in which she needs to pivot. - diclofenac (VOLTAREN) 75 MG EC tablet; Take 1 tablet (75 mg total) by mouth 2 (two) times daily.  Dispense: 60 tablet; Refill: 1   Orders Placed This Encounter  Procedures  . CMP14+EGFR   Meds ordered this encounter  Medications  . carvedilol (COREG) 25 MG tablet    Sig: Take 1 tablet (25  mg total) by mouth 2 (two) times daily with a meal.    Dispense:  180 tablet    Refill:  3  . hydrochlorothiazide (HYDRODIURIL) 25 MG tablet    Sig: Take 1 tablet (25 mg total) by mouth daily.    Dispense:  90 tablet    Refill:  3  . cyclobenzaprine (FLEXERIL) 10 MG tablet    Sig: Take 1 tablet (10 mg total) by mouth daily as needed.    Dispense:  90 tablet    Refill:  0  . gabapentin (NEURONTIN) 300 MG capsule    Sig: Take 1 capsule (300 mg total) by mouth 4 (four) times daily.    Dispense:  360 capsule    Refill:  2  . diclofenac (VOLTAREN) 75 MG EC tablet    Sig: Take 1 tablet (75 mg total) by mouth 2 (two) times daily.    Dispense:  60 tablet    Refill:  [place], DO [place] [phone]

## 2019-02-23 ENCOUNTER — Other Ambulatory Visit: Payer: Self-pay

## 2019-02-23 ENCOUNTER — Ambulatory Visit (INDEPENDENT_AMBULATORY_CARE_PROVIDER_SITE_OTHER): Payer: Medicaid Other | Admitting: Women's Health

## 2019-02-23 ENCOUNTER — Encounter: Payer: Self-pay | Admitting: Women's Health

## 2019-02-23 VITALS — BP 130/91 | HR 77 | Ht 64.0 in | Wt 186.0 lb

## 2019-02-23 DIAGNOSIS — Z30430 Encounter for insertion of intrauterine contraceptive device: Secondary | ICD-10-CM | POA: Diagnosis not present

## 2019-02-23 DIAGNOSIS — Z30432 Encounter for removal of intrauterine contraceptive device: Secondary | ICD-10-CM

## 2019-02-23 DIAGNOSIS — Z30433 Encounter for removal and reinsertion of intrauterine contraceptive device: Secondary | ICD-10-CM

## 2019-02-23 DIAGNOSIS — Z3202 Encounter for pregnancy test, result negative: Secondary | ICD-10-CM | POA: Diagnosis not present

## 2019-02-23 LAB — CMP14+EGFR
ALT: 27 IU/L (ref 0–32)
AST: 7 IU/L (ref 0–40)
Albumin/Globulin Ratio: 1.9 (ref 1.2–2.2)
Albumin: 4.8 g/dL (ref 3.8–4.8)
Alkaline Phosphatase: 49 IU/L (ref 39–117)
BUN/Creatinine Ratio: 16 (ref 9–23)
BUN: 13 mg/dL (ref 6–20)
Bilirubin Total: 0.3 mg/dL (ref 0.0–1.2)
CO2: 22 mmol/L (ref 20–29)
Calcium: 9.9 mg/dL (ref 8.7–10.2)
Chloride: 100 mmol/L (ref 96–106)
Creatinine, Ser: 0.82 mg/dL (ref 0.57–1.00)
GFR calc Af Amer: 106 mL/min/{1.73_m2} (ref >59)
GFR calc non Af Amer: 92 mL/min/{1.73_m2} (ref >59)
Globulin, Total: 2.5 g/dL (ref 1.5–4.5)
Glucose: 98 mg/dL (ref 65–99)
Potassium: 3.9 mmol/L (ref 3.5–5.2)
Sodium: 140 mmol/L (ref 134–144)
Total Protein: 7.3 g/dL (ref 6.0–8.5)

## 2019-02-23 LAB — POCT URINE PREGNANCY: Preg Test, Ur: NEGATIVE

## 2019-02-23 MED ORDER — LEVONORGESTREL 19.5 MCG/DAY IU IUD
INTRAUTERINE_SYSTEM | Freq: Once | INTRAUTERINE | Status: AC
  Administered 2019-02-23: 13:00:00 via INTRAUTERINE

## 2019-02-23 NOTE — Progress Notes (Signed)
   IUD REMOVAL & RE-INSERTION Patient name: Kathy Bradley MRN [id number]  Date of birth: @DOB  Subjective Findings:   @Kathy  D Bradley is a 38 y.o. G30P1011 Caucasian female being seen today for removal of a Mirena  IUD and insertion of a Liletta  IUD. Her IUD was placed 2015. This will be her 4th one.    No LMP recorded. (Menstrual status: IUD). Last pap2/19/20. Results were:  neg w/ -HRHPV  The risks and benefits of the method and placement have been thouroughly reviewed with the patient and all questions were answered.  Specifically the patient is aware of failure rate of 07/998, expulsion of the IUD and of possible perforation.  The patient is aware of irregular bleeding due to the method and understands the incidence of irregular bleeding diminishes with time.  Signed copy of informed consent in chart.  Pertinent History Reviewed:   Reviewed past medical,surgical, social, obstetrical and family history.  Reviewed problem list, medications and allergies. Objective Findings & Procedure:    Vitals:   02/23/19 1122  BP: (!) 130/91  Pulse: 77  Weight: 186 lb (84.4 kg)  Height: 5\' 4"  (1.626 m)  Body mass index is 31.93 kg/m.  Results for orders placed or performed in visit on 02/23/19 (from the past 24 hour(s))  POCT urine pregnancy   Collection Time: 02/23/19 11:30 AM  Result Value Ref Range   Preg Test, Ur Negative Negative     Time out was performed.  A graves speculum was placed in the vagina.  The cervix was visualized, prepped using Betadine. The strings were visible. They were grasped and the Mirena IUD was easily removed. The cervix was then grasped with a single-tooth tenaculum. The uterus was found to be neutral and it sounded to 7 cm.  Liletta  IUD placed per manufacturer's recommendations without complications. The strings were trimmed to approximately 3 cm.  The patient tolerated the procedure well.   Informal transvabdominal sonogram was performed by [employer], and the  proper placement of the IUD was verified.  Assessment & Plan:   1) Mirena IUD removal & Liletta insertion The patient was given post procedure instructions, including signs and symptoms of infection and to check for the strings after each menses or each month, and refraining from intercourse or anything in the vagina for 3 days. She was given a care card with date IUD placed, and date IUD to be removed. She is scheduled for a f/u appointment in 4 weeks.  Orders Placed This Encounter  Procedures  . POCT urine pregnancy    Follow-up: Return in about 4 weeks (around 03/23/2019) for F/U, MyChart Video.  [place], [facility] 02/23/2019 1:27 PM

## 2019-02-23 NOTE — Patient Instructions (Signed)
 Nothing in vagina for 3 days (no sex, douching, tampons, etc...)  Check your strings once a month to make sure you can feel them, if you are not able to please let us know  If you develop a fever of 100.4 or more in the next few weeks, or if you develop severe abdominal pain, please let us know  Use a backup method of birth control, such as condoms, for 2 weeks    Intrauterine Device Insertion, Care After  This sheet gives you information about how to care for yourself after your procedure. Your health care provider may also give you more specific instructions. If you have problems or questions, contact your health care provider. What can I expect after the procedure? After the procedure, it is common to have:  Cramps and pain in the abdomen.  Light bleeding (spotting) or heavier bleeding that is like your menstrual period. This may last for up to a few days.  Lower back pain.  Dizziness.  Headaches.  Nausea. Follow these instructions at home:  Before resuming sexual activity, check to make sure that you can feel the IUD string(s). You should be able to feel the end of the string(s) below the opening of your cervix. If your IUD string is in place, you may resume sexual activity. ? If you had a hormonal IUD inserted more than 7 days after your most recent period started, you will need to use a backup method of birth control for 7 days after IUD insertion. Ask your health care provider whether this applies to you.  Continue to check that the IUD is still in place by feeling for the string(s) after every menstrual period, or once a month.  Take over-the-counter and prescription medicines only as told by your health care provider.  Do not drive or use heavy machinery while taking prescription pain medicine.  Keep all follow-up visits as told by your health care provider. This is important. Contact a health care provider if:  You have bleeding that is heavier or lasts longer than  a normal menstrual cycle.  You have a fever.  You have cramps or abdominal pain that get worse or do not get better with medicine.  You develop abdominal pain that is new or is not in the same area of earlier cramping and pain.  You feel lightheaded or weak.  You have abnormal or bad-smelling discharge from your vagina.  You have pain during sexual activity.  You have any of the following problems with your IUD string(s): ? The string bothers or hurts you or your sexual partner. ? You cannot feel the string. ? The string has gotten longer.  You can feel the IUD in your vagina.  You think you may be pregnant, or you miss your menstrual period.  You think you may have an STI (sexually transmitted infection). Get help right away if:  You have flu-like symptoms.  You have a fever and chills.  You can feel that your IUD has slipped out of place. Summary  After the procedure, it is common to have cramps and pain in the abdomen. It is also common to have light bleeding (spotting) or heavier bleeding that is like your menstrual period.  Continue to check that the IUD is still in place by feeling for the string(s) after every menstrual period, or once a month.  Keep all follow-up visits as told by your health care provider. This is important.  Contact your health care provider if   you have problems with your IUD string(s), such as the string getting longer or bothering you or your sexual partner. This information is not intended to replace advice given to you by your health care provider. Make sure you discuss any questions you have with your health care provider. Document Released: 03/05/2011 Document Revised: 06/19/2017 Document Reviewed: 05/28/2016 Elsevier Patient Education  2020 Elsevier Inc[employer]  

## 2019-03-23 ENCOUNTER — Telehealth: Payer: Medicaid Other | Admitting: Women's Health

## 2019-06-23 ENCOUNTER — Other Ambulatory Visit: Payer: Self-pay | Admitting: Family Medicine

## 2019-06-23 DIAGNOSIS — M542 Cervicalgia: Secondary | ICD-10-CM

## 2019-09-28 ENCOUNTER — Other Ambulatory Visit: Payer: Self-pay | Admitting: Family Medicine

## 2019-09-28 DIAGNOSIS — M542 Cervicalgia: Secondary | ICD-10-CM

## 2019-09-28 NOTE — Telephone Encounter (Signed)
Appointment made

## 2019-10-07 ENCOUNTER — Other Ambulatory Visit: Payer: Self-pay

## 2019-10-07 ENCOUNTER — Ambulatory Visit (INDEPENDENT_AMBULATORY_CARE_PROVIDER_SITE_OTHER): Payer: Self-pay | Admitting: Family Medicine

## 2019-10-07 ENCOUNTER — Encounter: Payer: Self-pay | Admitting: Family Medicine

## 2019-10-07 DIAGNOSIS — M25562 Pain in left knee: Secondary | ICD-10-CM

## 2019-10-07 DIAGNOSIS — M542 Cervicalgia: Secondary | ICD-10-CM

## 2019-10-07 DIAGNOSIS — G8929 Other chronic pain: Secondary | ICD-10-CM

## 2019-10-07 DIAGNOSIS — I10 Essential (primary) hypertension: Secondary | ICD-10-CM

## 2019-10-07 MED ORDER — CYCLOBENZAPRINE HCL 10 MG PO TABS: 10.0000 mg | ORAL_TABLET | Freq: Two times a day (BID) | ORAL | 3 refills | Status: DC | PRN

## 2019-10-07 MED ORDER — HYDROCHLOROTHIAZIDE 25 MG PO TABS: 25.0000 mg | ORAL_TABLET | Freq: Every day | ORAL | 3 refills | Status: DC

## 2019-10-07 MED ORDER — DICLOFENAC SODIUM 75 MG PO TBEC: 75.0000 mg | DELAYED_RELEASE_TABLET | Freq: Two times a day (BID) | ORAL | 1 refills | Status: DC

## 2019-10-07 MED ORDER — GABAPENTIN 300 MG PO CAPS: 300.0000 mg | ORAL_CAPSULE | Freq: Four times a day (QID) | ORAL | 2 refills | Status: DC

## 2019-10-07 NOTE — Patient Instructions (Signed)
Physical medicine and rehabilitation may be our back up plan if symptoms progress.

## 2019-10-07 NOTE — Progress Notes (Addendum)
Subjective: CC: F/u HTN, chronic back pain PCP: Raliegh Ip, DO OZD:[id number] Kathy Bradley is a 39 y.o. female presenting to clinic today for:  1.  Hypertension Patient reports compliance with both her hydrochlorothiazide 25 mg daily and carvedilol 25 mg twice daily. No chest pain, SOB, headache, nausea, vomiting, edema.  2.  Chronic neck and back pain Patient reports stability on gabapentin, Flexeril and PRN use of the diclofenac. She uses flexeril on a daily basis.  Denies excessive daytime sedation, falls.  PsurgHx lumbar laminectomy.   ROS: Per HPI  Allergies  Allergen Reactions  . Penicillins Hives    No hx of anaphylaxis   Past Medical History:  Diagnosis Date  . Abnormal Pap smear of cervix   . Hypertension     Current Outpatient Medications:  .  carvedilol (COREG) 25 MG tablet, Take 1 tablet (25 mg total) by mouth 2 (two) times daily with a meal., Disp: 180 tablet, Rfl: 3 .  cyclobenzaprine (FLEXERIL) 10 MG tablet, TAKE 1 TABLET BY MOUTH EVERY DAY AS NEEDED, Disp: 90 tablet, Rfl: 0 .  diclofenac (VOLTAREN) 75 MG EC tablet, Take 1 tablet (75 mg total) by mouth 2 (two) times daily., Disp: 60 tablet, Rfl: 1 .  fexofenadine (ALLEGRA) 180 MG tablet, Take 180 mg by mouth daily., Disp: , Rfl:  .  gabapentin (NEURONTIN) 300 MG capsule, Take 1 capsule (300 mg total) by mouth 4 (four) times daily., Disp: 360 capsule, Rfl: 2 .  hydrochlorothiazide (HYDRODIURIL) 25 MG tablet, Take 1 tablet (25 mg total) by mouth daily., Disp: 90 tablet, Rfl: 3 .  levonorgestrel (LILETTA) 19.5 MCG/DAY IUD IUD, 1 each by Intrauterine route once., Disp: , Rfl:  Social History   Socioeconomic History  . Marital status: Divorced    Spouse name: Not on file  . Number of children: 1  . Years of education: Not on file  . Highest education level: Not on file  Occupational History  . Occupation: unemployed  Tobacco Use  . Smoking status: Current Every Day Smoker    Packs/day: 1.00    Years: 15.00     Pack years: 15.00    Types: Cigarettes    Last attempt to quit: 11/18/2013    Years since quitting: 5.8  . Smokeless tobacco: Never Used  Substance and Sexual Activity  . Alcohol use: Yes    Comment: occ.  . Drug use: No  . Sexual activity: Yes    Birth control/protection: I.U.Kathy.  Other Topics Concern  . Not on file  Social History Narrative  . Not on file   Social Determinants of Health   Financial Resource Strain:   . Difficulty of Paying Living Expenses:   Food Insecurity:   . Worried About Programme researcher, broadcasting/film/video in the Last Year:   . Barista in the Last Year:   Transportation Needs:   . Freight forwarder (Medical):   Marland Kitchen Lack of Transportation (Non-Medical):   Physical Activity:   . Days of Exercise per Week:   . Minutes of Exercise per Session:   Stress:   . Feeling of Stress :   Social Connections:   . Frequency of Communication with Friends and Family:   . Frequency of Social Gatherings with Friends and Family:   . Attends Religious Services:   . Active Member of Clubs or Organizations:   . Attends Banker Meetings:   Marland Kitchen Marital Status:   Intimate Partner Violence:   . Fear  of Current or Ex-Partner:   . Emotionally Abused:   Marland Kitchen Physically Abused:   . Sexually Abused:    Family History  Problem Relation Age of Onset  . Hypertension Mother   . Hypertension Father   . Bipolar disorder Brother   . Drug abuse Brother   . Hypertension Maternal Grandmother   . Hypertension Maternal Grandfather   . Cancer Paternal Aunt        cervical  . Cancer Paternal Grandmother        ovarian    Objective: Office vital signs reviewed. BP 124/82   Pulse 85   Temp 97.7 F (36.5 C)   Ht 5\' 4"  (1.626 m)   Wt 192 lb (87.1 kg)   SpO2 96%   BMI 32.96 kg/m   Physical Examination:  General: Awake, alert, well nourished, No acute distress HEENT: Normal, sclera white, MMM Cardio: regular rate and rhythm, S1S2 heard, no murmurs appreciated Pulm:  clear to auscultation bilaterally, no wheezes, rhonchi or rales; normal work of breathing on room air Extremities: warm, well perfused, No edema, cyanosis or clubbing; +2 pulses bilaterally MSK: normal gait and station  Lumbar spine: full painless AROM in flexion and rotation.  Pain with extension.  Extension also limited.  No midline TTP  Assessment/ Plan: 39 y.o. female   1. Neck pain Stable.  Medications renewed - cyclobenzaprine (FLEXERIL) 10 MG tablet; Take 1 tablet (10 mg total) by mouth 2 (two) times daily as needed for muscle spasms.  Dispense: 180 tablet; Refill: 3 - diclofenac (VOLTAREN) 75 MG EC tablet; Take 1 tablet (75 mg total) by mouth 2 (two) times daily.  Dispense: 60 tablet; Refill: 1 - gabapentin (NEURONTIN) 300 MG capsule; Take 1 capsule (300 mg total) by mouth 4 (four) times daily.  Dispense: 360 capsule; Refill: 2  2. Chronic pain of left knee stable - diclofenac (VOLTAREN) 75 MG EC tablet; Take 1 tablet (75 mg total) by mouth 2 (two) times daily.  Dispense: 60 tablet; Refill: 1  3. Essential hypertension Controlled.  - hydrochlorothiazide (HYDRODIURIL) 25 MG tablet; Take 1 tablet (25 mg total) by mouth daily.  Dispense: 90 tablet; Refill: 3  No orders of the defined types were placed in this encounter.  No orders of the defined types were placed in this encounter.    Janora Norlander, DO [place] [phone]

## 2020-03-29 ENCOUNTER — Other Ambulatory Visit: Payer: Self-pay | Admitting: Family Medicine

## 2020-03-29 DIAGNOSIS — I10 Essential (primary) hypertension: Secondary | ICD-10-CM

## 2020-04-24 ENCOUNTER — Other Ambulatory Visit: Payer: Self-pay | Admitting: Family Medicine

## 2020-04-24 DIAGNOSIS — I10 Essential (primary) hypertension: Secondary | ICD-10-CM

## 2020-04-25 NOTE — Telephone Encounter (Signed)
[place] NTBS 30 days given 03/29/20

## 2020-05-08 ENCOUNTER — Other Ambulatory Visit: Payer: Self-pay

## 2020-05-08 ENCOUNTER — Encounter: Payer: Self-pay | Admitting: Family Medicine

## 2020-05-08 DIAGNOSIS — I10 Essential (primary) hypertension: Secondary | ICD-10-CM

## 2020-05-08 MED ORDER — CARVEDILOL 25 MG PO TABS: 25.0000 mg | ORAL_TABLET | Freq: Two times a day (BID) | ORAL | 0 refills | Status: DC

## 2020-06-02 ENCOUNTER — Other Ambulatory Visit: Payer: Self-pay | Admitting: Family Medicine

## 2020-06-02 DIAGNOSIS — I10 Essential (primary) hypertension: Secondary | ICD-10-CM

## 2020-06-06 ENCOUNTER — Encounter: Payer: Self-pay | Admitting: Family Medicine

## 2020-06-06 ENCOUNTER — Other Ambulatory Visit: Payer: Self-pay

## 2020-06-06 ENCOUNTER — Ambulatory Visit (INDEPENDENT_AMBULATORY_CARE_PROVIDER_SITE_OTHER): Payer: Self-pay | Admitting: Family Medicine

## 2020-06-06 VITALS — BP 132/88 | HR 89 | Temp 97.6°F | Ht 64.0 in | Wt 176.0 lb

## 2020-06-06 DIAGNOSIS — M542 Cervicalgia: Secondary | ICD-10-CM

## 2020-06-06 DIAGNOSIS — Z130 Encounter for screening for diseases of the blood and blood-forming organs and certain disorders involving the immune mechanism: Secondary | ICD-10-CM

## 2020-06-06 DIAGNOSIS — Z1329 Encounter for screening for other suspected endocrine disorder: Secondary | ICD-10-CM

## 2020-06-06 DIAGNOSIS — I10 Essential (primary) hypertension: Secondary | ICD-10-CM

## 2020-06-06 DIAGNOSIS — Z23 Encounter for immunization: Secondary | ICD-10-CM

## 2020-06-06 LAB — LIPID PANEL

## 2020-06-06 MED ORDER — CARVEDILOL 25 MG PO TABS: 25.0000 mg | ORAL_TABLET | Freq: Two times a day (BID) | ORAL | 3 refills | Status: DC

## 2020-06-06 MED ORDER — GABAPENTIN 300 MG PO CAPS: 300.0000 mg | ORAL_CAPSULE | Freq: Four times a day (QID) | ORAL | 3 refills | Status: DC

## 2020-06-06 MED ORDER — CYCLOBENZAPRINE HCL 10 MG PO TABS: 10.0000 mg | ORAL_TABLET | Freq: Three times a day (TID) | ORAL | 3 refills | Status: DC | PRN

## 2020-06-06 MED ORDER — HYDROCHLOROTHIAZIDE 25 MG PO TABS: 25.0000 mg | ORAL_TABLET | Freq: Every day | ORAL | 3 refills | Status: DC

## 2020-06-06 NOTE — Patient Instructions (Signed)

## 2020-06-06 NOTE — Progress Notes (Signed)
Subjective: CC: Follow-up hypertension, chronic back pain, neuropathy PCP: Janora Norlander, DO Kathy Bradley is a 39 y.o. female presenting to clinic today for:  1.  Hypertension Patient reports compliance with hydrochlorothiazide 25 mg daily and Coreg 25 mg twice daily.  No chest pain, shortness of breath or edema  2.  Chronic back pain/neuropathy Patient reports compliance with her Flexeril twice daily, gabapentin every 6 hours.  She discontinued the Voltaren.  She notes that the Flexeril does seem to work well but she starts having breakthrough pain around midday.  She is wanting to know if we can add an extra dose there.  Denies any excessive daytime sedation, falls, visual or auditory hallucinations.  Sometimes the gabapentin will make her feel tingly but she is not had any other issues with this.  She is wanting to know if it safe to use topical CBD oil.  She continues to have some issues with the upper back, particularly if she has been physically active.   ROS: Per HPI  Allergies  Allergen Reactions   Penicillins Hives    No hx of anaphylaxis   Past Medical History:  Diagnosis Date   Abnormal Pap smear of cervix    Hypertension     Current Outpatient Medications:    carvedilol (COREG) 25 MG tablet, Take 1 tablet (25 mg total) by mouth 2 (two) times daily with a meal., Disp: 60 tablet, Rfl: 0   cyclobenzaprine (FLEXERIL) 10 MG tablet, Take 1 tablet (10 mg total) by mouth 2 (two) times daily as needed for muscle spasms., Disp: 180 tablet, Rfl: 3   diclofenac (VOLTAREN) 75 MG EC tablet, Take 1 tablet (75 mg total) by mouth 2 (two) times daily., Disp: 60 tablet, Rfl: 1   fexofenadine (ALLEGRA) 180 MG tablet, Take 180 mg by mouth daily., Disp: , Rfl:    gabapentin (NEURONTIN) 300 MG capsule, Take 1 capsule (300 mg total) by mouth 4 (four) times daily., Disp: 360 capsule, Rfl: 2   hydrochlorothiazide (HYDRODIURIL) 25 MG tablet, Take 1 tablet (25 mg total) by  mouth daily., Disp: 90 tablet, Rfl: 3   levonorgestrel (LILETTA) 19.5 MCG/DAY IUD IUD, 1 each by Intrauterine route once., Disp: , Rfl:  Social History   Socioeconomic History   Marital status: Divorced    Spouse name: Not on file   Number of children: 1   Years of education: Not on file   Highest education level: Not on file  Occupational History   Occupation: unemployed  Tobacco Use   Smoking status: Current Every Day Smoker    Packs/day: 1.00    Years: 15.00    Pack years: 15.00    Types: Cigarettes    Last attempt to quit: 11/18/2013    Years since quitting: 6.5   Smokeless tobacco: Never Used  Vaping Use   Vaping Use: Former  Substance and Sexual Activity   Alcohol use: Yes    Comment: occ.   Drug use: No   Sexual activity: Yes    Birth control/protection: I.U.D.  Other Topics Concern   Not on file  Social History Narrative   Not on file   Social Determinants of Health   Financial Resource Strain:    Difficulty of Paying Living Expenses: Not on file  Food Insecurity:    Worried About [place] in the Last Year: Not on file   Ran Out of Food in the Last Year: Not on file  Transportation Needs:  Lack of Transportation (Medical): Not on file   Lack of Transportation (Non-Medical): Not on file  Physical Activity:    Days of Exercise per Week: Not on file   Minutes of Exercise per Session: Not on file  Stress:    Feeling of Stress : Not on file  Social Connections:    Frequency of Communication with Friends and Family: Not on file   Frequency of Social Gatherings with Friends and Family: Not on file   Attends Religious Services: Not on file   Active Member of Clubs or Organizations: Not on file   Attends Archivist Meetings: Not on file   Marital Status: Not on file  Intimate Partner Violence:    Fear of Current or Ex-Partner: Not on file   Emotionally Abused: Not on file   Physically Abused: Not on file    Sexually Abused: Not on file   Family History  Problem Relation Age of Onset   Hypertension Mother    Hypertension Father    Bipolar disorder Brother    Drug abuse Brother    Hypertension Maternal Grandmother    Hypertension Maternal Grandfather    Cancer Paternal Aunt        cervical   Cancer Paternal Grandmother        ovarian    Objective: Office vital signs reviewed. BP 132/88    Pulse 89    Temp 97.6 F (36.4 C) (Temporal)    Ht '5\' 4"'  (1.626 m)    Wt 176 lb (79.8 kg)    SpO2 98%    BMI 30.21 kg/m   Physical Examination:  General: Awake, alert, well nourished, No acute distress HEENT: Normal, sclera white, MMM Cardio: regular rate and rhythm, S1S2 heard, no murmurs appreciated Pulm: clear to auscultation bilaterally, no wheezes, rhonchi or rales; normal work of breathing on room air Extremities: warm, well perfused, No edema, cyanosis or clubbing; +2 pulses bilaterally MSK: normal gait and station  Assessment/ Plan: 39 y.o. female   1. Essential hypertension Well-controlled.  Continue current regimen.  Check fasting labs - CMP14+EGFR - carvedilol (COREG) 25 MG tablet; Take 1 tablet (25 mg total) by mouth 2 (two) times daily with a meal.  Dispense: 180 tablet; Refill: 3 - hydrochlorothiazide (HYDRODIURIL) 25 MG tablet; Take 1 tablet (25 mg total) by mouth daily.  Dispense: 90 tablet; Refill: 3 - Lipid Panel  2. Neck pain I will increase her dose of Flexeril to 3 times daily.  Caution sedation.  Gabapentin continued at current dose. - cyclobenzaprine (FLEXERIL) 10 MG tablet; Take 1 tablet (10 mg total) by mouth 3 (three) times daily as needed for muscle spasms.  Dispense: 270 tablet; Refill: 3 - gabapentin (NEURONTIN) 300 MG capsule; Take 1 capsule (300 mg total) by mouth 4 (four) times daily.  Dispense: 360 capsule; Refill: 3  3. Screening, anemia, deficiency, iron - CBC  4. Screening for thyroid disorder - TSH   No orders of the defined types were  placed in this encounter.  No orders of the defined types were placed in this encounter.    Janora Norlander, DO [place] [phone]

## 2020-06-07 LAB — LIPID PANEL
Chol/HDL Ratio: 4.3 ratio (ref 0.0–4.4)
Cholesterol, Total: 213 mg/dL — ABNORMAL HIGH (ref 100–199)
HDL: 50 mg/dL
LDL Chol Calc (NIH): 142 mg/dL — ABNORMAL HIGH (ref 0–99)
Triglycerides: 119 mg/dL (ref 0–149)
VLDL Cholesterol Cal: 21 mg/dL (ref 5–40)

## 2020-06-07 LAB — CMP14+EGFR
ALT: 15 [IU]/L (ref 0–32)
AST: 7 [IU]/L (ref 0–40)
Albumin/Globulin Ratio: 1.7 (ref 1.2–2.2)
Albumin: 4.7 g/dL (ref 3.8–4.8)
Alkaline Phosphatase: 53 [IU]/L (ref 44–121)
BUN/Creatinine Ratio: 16 (ref 9–23)
BUN: 12 mg/dL (ref 6–20)
Bilirubin Total: 0.4 mg/dL (ref 0.0–1.2)
CO2: 22 mmol/L (ref 20–29)
Calcium: 9.9 mg/dL (ref 8.7–10.2)
Chloride: 99 mmol/L (ref 96–106)
Creatinine, Ser: 0.75 mg/dL (ref 0.57–1.00)
GFR calc Af Amer: 118 mL/min/{1.73_m2}
GFR calc non Af Amer: 102 mL/min/{1.73_m2}
Globulin, Total: 2.7 g/dL (ref 1.5–4.5)
Glucose: 95 mg/dL (ref 65–99)
Potassium: 3.8 mmol/L (ref 3.5–5.2)
Sodium: 139 mmol/L (ref 134–144)
Total Protein: 7.4 g/dL (ref 6.0–8.5)

## 2020-06-07 LAB — CBC
Hematocrit: 44.1 % (ref 34.0–46.6)
Hemoglobin: 15.4 g/dL (ref 11.1–15.9)
MCH: 30.6 pg (ref 26.6–33.0)
MCHC: 34.9 g/dL (ref 31.5–35.7)
MCV: 88 fL (ref 79–97)
Platelets: 209 10*3/uL (ref 150–450)
RBC: 5.04 x10E6/uL (ref 3.77–5.28)
RDW: 13.6 % (ref 11.7–15.4)
WBC: 9.4 10*3/uL (ref 3.4–10.8)

## 2020-06-07 LAB — TSH: TSH: 2.1 u[IU]/mL (ref 0.450–4.500)

## 2020-10-02 ENCOUNTER — Other Ambulatory Visit: Payer: Self-pay | Admitting: Family Medicine

## 2020-10-02 DIAGNOSIS — M542 Cervicalgia: Secondary | ICD-10-CM

## 2021-07-06 ENCOUNTER — Other Ambulatory Visit: Payer: Self-pay | Admitting: Family Medicine

## 2021-07-06 DIAGNOSIS — M542 Cervicalgia: Secondary | ICD-10-CM

## 2021-07-06 DIAGNOSIS — I10 Essential (primary) hypertension: Secondary | ICD-10-CM

## 2021-07-08 ENCOUNTER — Other Ambulatory Visit: Payer: Self-pay | Admitting: Family Medicine

## 2021-07-08 DIAGNOSIS — I10 Essential (primary) hypertension: Secondary | ICD-10-CM

## 2021-07-08 DIAGNOSIS — M542 Cervicalgia: Secondary | ICD-10-CM

## 2021-07-08 MED ORDER — HYDROCHLOROTHIAZIDE 25 MG PO TABS: 25.0000 mg | ORAL_TABLET | Freq: Every day | ORAL | 0 refills | Status: DC

## 2021-07-08 MED ORDER — CARVEDILOL 25 MG PO TABS: 25.0000 mg | ORAL_TABLET | Freq: Two times a day (BID) | ORAL | 0 refills | Status: DC

## 2021-07-08 MED ORDER — GABAPENTIN 300 MG PO CAPS: 300.0000 mg | ORAL_CAPSULE | Freq: Four times a day (QID) | ORAL | 0 refills | Status: DC

## 2021-07-08 NOTE — Telephone Encounter (Signed)
Pt aware refill sent to pharmacy 

## 2021-08-19 ENCOUNTER — Encounter: Payer: Self-pay | Admitting: Family Medicine

## 2021-08-19 ENCOUNTER — Ambulatory Visit (INDEPENDENT_AMBULATORY_CARE_PROVIDER_SITE_OTHER): Payer: Self-pay | Admitting: Family Medicine

## 2021-08-19 VITALS — BP 126/85 | HR 84 | Temp 97.6°F | Ht 64.0 in | Wt 172.6 lb

## 2021-08-19 DIAGNOSIS — M542 Cervicalgia: Secondary | ICD-10-CM

## 2021-08-19 DIAGNOSIS — F17200 Nicotine dependence, unspecified, uncomplicated: Secondary | ICD-10-CM

## 2021-08-19 DIAGNOSIS — J9801 Acute bronchospasm: Secondary | ICD-10-CM

## 2021-08-19 DIAGNOSIS — I10 Essential (primary) hypertension: Secondary | ICD-10-CM

## 2021-08-19 MED ORDER — HYDROCHLOROTHIAZIDE 25 MG PO TABS: 25.0000 mg | ORAL_TABLET | Freq: Every day | ORAL | 3 refills | Status: DC

## 2021-08-19 MED ORDER — PREDNISONE 10 MG (21) PO TBPK: ORAL_TABLET | ORAL | 0 refills | Status: DC

## 2021-08-19 MED ORDER — ALBUTEROL SULFATE HFA 108 (90 BASE) MCG/ACT IN AERS: 2.0000 | INHALATION_SPRAY | Freq: Four times a day (QID) | RESPIRATORY_TRACT | 0 refills | Status: DC | PRN

## 2021-08-19 MED ORDER — CYCLOBENZAPRINE HCL 10 MG PO TABS: ORAL_TABLET | ORAL | 3 refills | Status: DC

## 2021-08-19 MED ORDER — CARVEDILOL 25 MG PO TABS: 25.0000 mg | ORAL_TABLET | Freq: Two times a day (BID) | ORAL | 3 refills | Status: DC

## 2021-08-19 MED ORDER — GABAPENTIN 300 MG PO CAPS: 300.0000 mg | ORAL_CAPSULE | Freq: Four times a day (QID) | ORAL | 3 refills | Status: DC

## 2021-08-19 NOTE — Progress Notes (Signed)
Subjective: CC: Follow-up hypertension, chronic pain syndrome PCP: Kathy Norlander, DO [id number] Kathy Bradley is a 41 y.o. female presenting to clinic today for:  1.  Hypertension Patient reports compliance with hydrochlorothiazide 25 mg daily, Coreg 25 mg twice daily.  No chest pain, shortness of breath, edema, dizziness or headaches  2.  Chronic pain syndrome Patient is compliant with gabapentin every 6 hours and Flexeril up to 3 times daily.  She denies any excessive daytime sedation, edema, falls, dizziness.  Pain is under fair control.  She sometimes has flareups of her back that require steroid injections.  3.  Cough Patient reports that she was sick recently and has an ongoing cough.  She is an active every day smoker.  Denies any hemoptysis, fevers.  Does not have an albuterol inhaler but would be willing to use 1 if this would help   ROS: Per HPI  Allergies  Allergen Reactions   Penicillins Hives    No hx of anaphylaxis   Past Medical History:  Diagnosis Date   Abnormal Pap smear of cervix    Hypertension     Current Outpatient Medications:    carvedilol (COREG) 25 MG tablet, Take 1 tablet (25 mg total) by mouth 2 (two) times daily with a meal., Disp: 180 tablet, Rfl: 0   cyclobenzaprine (FLEXERIL) 10 MG tablet, TAKE 1 TABLET BY MOUTH TWICE A DAY AS NEEDED FOR MUSCLE SPASMS, Disp: 180 tablet, Rfl: 3   fexofenadine (ALLEGRA) 180 MG tablet, Take 180 mg by mouth daily., Disp: , Rfl:    gabapentin (NEURONTIN) 300 MG capsule, Take 1 capsule (300 mg total) by mouth 4 (four) times daily., Disp: 360 capsule, Rfl: 0   hydrochlorothiazide (HYDRODIURIL) 25 MG tablet, Take 1 tablet (25 mg total) by mouth daily., Disp: 90 tablet, Rfl: 0   levonorgestrel (LILETTA) 19.5 MCG/DAY IUD IUD, 1 each by Intrauterine route once., Disp: , Rfl:  Social History   Socioeconomic History   Marital status: Divorced    Spouse name: Not on file   Number of children: 1   Years of education: Not  on file   Highest education level: Not on file  Occupational History   Occupation: unemployed  Tobacco Use   Smoking status: Every Day    Packs/day: 1.00    Years: 15.00    Pack years: 15.00    Types: Cigarettes    Last attempt to quit: 11/18/2013    Years since quitting: 7.7   Smokeless tobacco: Never  Vaping Use   Vaping Use: Former  Substance and Sexual Activity   Alcohol use: Yes    Comment: occ.   Drug use: No   Sexual activity: Yes    Birth control/protection: I.U.Kathy.  Other Topics Concern   Not on file  Social History Narrative   Not on file   Social Determinants of Health   Financial Resource Strain: Not on file  Food Insecurity: Not on file  Transportation Needs: Not on file  Physical Activity: Not on file  Stress: Not on file  Social Connections: Not on file  Intimate Partner Violence: Not on file   Family History  Problem Relation Age of Onset   Hypertension Mother    Hypertension Father    Bipolar disorder Brother    Drug abuse Brother    Hypertension Maternal Grandmother    Hypertension Maternal Grandfather    Cancer Paternal Aunt        cervical   Cancer Paternal Grandmother  ovarian    Objective: Office vital signs reviewed. BP 126/85    Pulse 84    Temp 97.6 F (36.4 C)    Ht _0  (1.626 m)    Wt 172 lb 9.6 oz (78.3 kg)    SpO2 98%    BMI 29.63 kg/m   Physical Examination:  General: Awake, alert, well nourished, No acute distress HEENT: Sclera white.  Moist mucous membranes Cardio: regular rate and rhythm, S1S2 heard, no murmurs appreciated Pulm: Mild wheezes with fair air movement.  No rhonchi or rales.  Normal work of breathing on room air Extremities: warm, well perfused, No edema, cyanosis or clubbing; +2 pulses bilaterally MSK: Normal gait and station.  Ambulating independently.  No palpable bony defects within the spine.  Normal tone.  Assessment/ Plan: 41 y.o. female   Essential hypertension - Plan: CMP14+EGFR, carvedilol  (COREG) 25 MG tablet, hydrochlorothiazide (HYDRODIURIL) 25 MG tablet, DISCONTINUED: carvedilol (COREG) 25 MG tablet, DISCONTINUED: hydrochlorothiazide (HYDRODIURIL) 25 MG tablet  Neck pain - Plan: gabapentin (NEURONTIN) 300 MG capsule, predniSONE (STERAPRED UNI-PAK 21 TAB) 10 MG (21) TBPK tablet, cyclobenzaprine (FLEXERIL) 10 MG tablet, DISCONTINUED: cyclobenzaprine (FLEXERIL) 10 MG tablet  Bronchospasm - Plan: albuterol (VENTOLIN HFA) 108 (90 Base) MCG/ACT inhaler, DISCONTINUED: albuterol (VENTOLIN HFA) 108 (90 Base) MCG/ACT inhaler  Tobacco use disorder  Blood pressure is controlled.  Continue current regimen.  Refills have been sent and we have rerouted these to a mail order pharmacy that is less expensive  Pain is chronic and stable.  I given her a written prescription for prednisone Dosepak to use only if she has a flareup.  She will continue her Flexeril and gabapentin as prescribed.  For what sounds like bronchospasm in the setting of tobacco use disorder of given her albuterol inhaler.  Discussed red flag signs and symptoms warranting further evaluation.  Smoking cessation is encouraged.  She may follow-up in 1 year, sooner if concerns arise  No orders of the defined types were placed in this encounter.  No orders of the defined types were placed in this encounter.    Kathy Norlander, DO Kathy Bradley [phone]

## 2021-08-19 NOTE — Patient Instructions (Signed)
[email or web address] Albuterol if needed for cough/ wheezing Prednisone if needed for flareup of back pain (take in AM, it can cause restlessness otherwise)

## 2021-08-20 LAB — CMP14+EGFR
ALT: 16 IU/L (ref 0–32)
AST: 6 IU/L (ref 0–40)
Albumin/Globulin Ratio: 2.3 — ABNORMAL HIGH (ref 1.2–2.2)
Albumin: 5 g/dL — ABNORMAL HIGH (ref 3.8–4.8)
Alkaline Phosphatase: 44 IU/L (ref 44–121)
BUN/Creatinine Ratio: 13 (ref 9–23)
BUN: 10 mg/dL (ref 6–20)
Bilirubin Total: 0.3 mg/dL (ref 0.0–1.2)
CO2: 22 mmol/L (ref 20–29)
Calcium: 9.5 mg/dL (ref 8.7–10.2)
Chloride: 101 mmol/L (ref 96–106)
Creatinine, Ser: 0.75 mg/dL (ref 0.57–1.00)
Globulin, Total: 2.2 g/dL (ref 1.5–4.5)
Glucose: 125 mg/dL — ABNORMAL HIGH (ref 70–99)
Potassium: 3.6 mmol/L (ref 3.5–5.2)
Sodium: 141 mmol/L (ref 134–144)
Total Protein: 7.2 g/dL (ref 6.0–8.5)
eGFR: 104 mL/min/{1.73_m2} (ref >59)

## 2021-08-20 MED ORDER — HYDROCHLOROTHIAZIDE 25 MG PO TABS: 25.0000 mg | ORAL_TABLET | Freq: Every day | ORAL | 3 refills | Status: DC

## 2021-08-20 MED ORDER — CYCLOBENZAPRINE HCL 10 MG PO TABS: ORAL_TABLET | ORAL | 3 refills | Status: DC

## 2021-08-20 MED ORDER — CARVEDILOL 25 MG PO TABS: 25.0000 mg | ORAL_TABLET | Freq: Two times a day (BID) | ORAL | 3 refills | Status: DC

## 2021-08-20 MED ORDER — ALBUTEROL SULFATE HFA 108 (90 BASE) MCG/ACT IN AERS: 2.0000 | INHALATION_SPRAY | Freq: Four times a day (QID) | RESPIRATORY_TRACT | 0 refills | Status: DC | PRN

## 2021-09-28 ENCOUNTER — Encounter: Payer: Self-pay | Admitting: Family Medicine

## 2021-09-30 ENCOUNTER — Other Ambulatory Visit: Payer: Self-pay | Admitting: Family Medicine

## 2021-09-30 DIAGNOSIS — M542 Cervicalgia: Secondary | ICD-10-CM

## 2021-09-30 MED ORDER — PREDNISONE 10 MG (21) PO TBPK: ORAL_TABLET | ORAL | 0 refills | Status: DC

## 2021-09-30 NOTE — Addendum Note (Signed)
Addended by: Raliegh Ip on: 09/30/2021 11:32 AM ? ? Modules accepted: Orders ? ?

## 2021-10-02 ENCOUNTER — Other Ambulatory Visit: Payer: Self-pay | Admitting: Family Medicine

## 2021-10-02 DIAGNOSIS — M542 Cervicalgia: Secondary | ICD-10-CM

## 2021-10-02 MED ORDER — PREDNISONE 10 MG (21) PO TBPK: ORAL_TABLET | ORAL | 0 refills | Status: DC

## 2022-01-27 ENCOUNTER — Ambulatory Visit (INDEPENDENT_AMBULATORY_CARE_PROVIDER_SITE_OTHER): Payer: Self-pay

## 2022-01-27 DIAGNOSIS — Z111 Encounter for screening for respiratory tuberculosis: Secondary | ICD-10-CM

## 2022-01-27 NOTE — Progress Notes (Signed)
TB skin test placed to left forearm.  Appointment scheduled to return for TB test read on 01/29/22.  Patient tolerated well.

## 2022-01-29 ENCOUNTER — Ambulatory Visit: Payer: Medicaid Other

## 2022-01-29 DIAGNOSIS — Z111 Encounter for screening for respiratory tuberculosis: Secondary | ICD-10-CM

## 2022-01-29 LAB — TB SKIN TEST
Induration: 0 mm
TB Skin Test: NEGATIVE

## 2022-01-29 NOTE — Progress Notes (Signed)
Patient here today to have TB skin test read.  Results were negative and letter was printed stating this for patient.

## 2022-03-08 ENCOUNTER — Encounter: Payer: Self-pay | Admitting: Family Medicine

## 2022-08-19 ENCOUNTER — Ambulatory Visit (INDEPENDENT_AMBULATORY_CARE_PROVIDER_SITE_OTHER): Payer: Self-pay | Admitting: Family Medicine

## 2022-08-19 ENCOUNTER — Encounter: Payer: Self-pay | Admitting: Family Medicine

## 2022-08-19 VITALS — BP 120/84 | HR 94 | Temp 98.6°F | Ht 64.0 in | Wt 169.0 lb

## 2022-08-19 DIAGNOSIS — M542 Cervicalgia: Secondary | ICD-10-CM

## 2022-08-19 DIAGNOSIS — R7303 Prediabetes: Secondary | ICD-10-CM

## 2022-08-19 DIAGNOSIS — Z6833 Body mass index (BMI) 33.0-33.9, adult: Secondary | ICD-10-CM

## 2022-08-19 DIAGNOSIS — Z87891 Personal history of nicotine dependence: Secondary | ICD-10-CM

## 2022-08-19 DIAGNOSIS — Z0001 Encounter for general adult medical examination with abnormal findings: Secondary | ICD-10-CM

## 2022-08-19 DIAGNOSIS — Z0000 Encounter for general adult medical examination without abnormal findings: Secondary | ICD-10-CM

## 2022-08-19 DIAGNOSIS — I10 Essential (primary) hypertension: Secondary | ICD-10-CM

## 2022-08-19 LAB — CMP14+EGFR
ALT: 26 IU/L (ref 0–32)
AST: 12 IU/L (ref 0–40)
Albumin/Globulin Ratio: 2.1 (ref 1.2–2.2)
Albumin: 4.6 g/dL (ref 3.9–4.9)
Alkaline Phosphatase: 43 IU/L — ABNORMAL LOW (ref 44–121)
BUN/Creatinine Ratio: 16 (ref 9–23)
BUN: 11 mg/dL (ref 6–20)
Bilirubin Total: 0.2 mg/dL (ref 0.0–1.2)
CO2: 20 mmol/L (ref 20–29)
Calcium: 9.7 mg/dL (ref 8.7–10.2)
Chloride: 100 mmol/L (ref 96–106)
Creatinine, Ser: 0.7 mg/dL (ref 0.57–1.00)
Globulin, Total: 2.2 g/dL (ref 1.5–4.5)
Glucose: 89 mg/dL (ref 70–99)
Potassium: 3.7 mmol/L (ref 3.5–5.2)
Sodium: 138 mmol/L (ref 134–144)
Total Protein: 6.8 g/dL (ref 6.0–8.5)
eGFR: 113 mL/min/{1.73_m2} (ref >59)

## 2022-08-19 LAB — CBC WITH DIFFERENTIAL/PLATELET
Basophils Absolute: 0 10*3/uL (ref 0.0–0.2)
Basos: 1 %
EOS (ABSOLUTE): 0.2 10*3/uL (ref 0.0–0.4)
Eos: 2 %
Hematocrit: 38.7 % (ref 34.0–46.6)
Hemoglobin: 13.2 g/dL (ref 11.1–15.9)
Immature Grans (Abs): 0 10*3/uL (ref 0.0–0.1)
Immature Granulocytes: 0 %
Lymphocytes Absolute: 2.7 10*3/uL (ref 0.7–3.1)
Lymphs: 31 %
MCH: 30.4 pg (ref 26.6–33.0)
MCHC: 34.1 g/dL (ref 31.5–35.7)
MCV: 89 fL (ref 79–97)
Monocytes Absolute: 0.5 10*3/uL (ref 0.1–0.9)
Monocytes: 6 %
Neutrophils Absolute: 5.2 10*3/uL (ref 1.4–7.0)
Neutrophils: 60 %
Platelets: 179 10*3/uL (ref 150–450)
RBC: 4.34 x10E6/uL (ref 3.77–5.28)
RDW: 12.4 % (ref 11.7–15.4)
WBC: 8.6 10*3/uL (ref 3.4–10.8)

## 2022-08-19 LAB — BAYER DCA HB A1C WAIVED: HB A1C (BAYER DCA - WAIVED): 6.1 % — ABNORMAL HIGH (ref 4.8–5.6)

## 2022-08-19 LAB — LIPID PANEL
Chol/HDL Ratio: 3.8 ratio (ref 0.0–4.4)
Cholesterol, Total: 216 mg/dL — ABNORMAL HIGH (ref 100–199)
HDL: 57 mg/dL (ref >39)
LDL Chol Calc (NIH): 118 mg/dL — ABNORMAL HIGH (ref 0–99)
Triglycerides: 237 mg/dL — ABNORMAL HIGH (ref 0–149)
VLDL Cholesterol Cal: 41 mg/dL — ABNORMAL HIGH (ref 5–40)

## 2022-08-19 LAB — TSH: TSH: 1.96 u[IU]/mL (ref 0.450–4.500)

## 2022-08-19 MED ORDER — GABAPENTIN 300 MG PO CAPS: 300.0000 mg | ORAL_CAPSULE | Freq: Four times a day (QID) | ORAL | 3 refills | Status: DC

## 2022-08-19 MED ORDER — CARVEDILOL 25 MG PO TABS: 25.0000 mg | ORAL_TABLET | Freq: Two times a day (BID) | ORAL | 3 refills | Status: DC

## 2022-08-19 MED ORDER — CYCLOBENZAPRINE HCL 10 MG PO TABS: ORAL_TABLET | ORAL | 3 refills | Status: DC

## 2022-08-19 MED ORDER — HYDROCHLOROTHIAZIDE 25 MG PO TABS: 25.0000 mg | ORAL_TABLET | Freq: Every day | ORAL | 3 refills | Status: DC

## 2022-08-19 NOTE — Progress Notes (Signed)
I have separately seen and examined the patient. I have discussed the findings and exam with student Kathy Bradley and agree with the below note.  My changes/additions are outlined in BLUE.    S: Overall she seems to be doing pretty well.  I Kathy Bradley have concerns that she has replaced that her nicotine addiction with sugar.  She does not report any signs or symptoms suggestive of hyperglycemia.  She denies any visual changes, chest Bradley, shortness of breath, edema, dizziness, change in urine output.  No hemoptysis, unplanned weight loss, night sweats, shortness of breath or change in exercise tolerance.  No wheezing.  She is currently working for [employer] now.  Sometimes does have increased stress surrounding that job as they have limited help  O: Vitals:   08/19/22 0910  BP: 120/84  Pulse: 94  Temp: 98.6 F (37 C)  SpO2: 97%    General appearance: alert, cooperative, appears stated age, and no distress Head: Normocephalic, without obvious abnormality, atraumatic Eyes: negative findings: lids and lashes normal, conjunctivae and sclerae normal, corneas clear, and pupils equal, round, reactive to light and accomodation Ears: normal TM's and external ear canals both ears Nose: Nares normal. Septum midline. Mucosa normal. No drainage or sinus tenderness. Throat: lips, mucosa, and tongue normal; teeth and gums normal Neck: no adenopathy, supple, symmetrical, trachea midline, and thyroid not enlarged, symmetric, no tenderness/mass/nodules Back: symmetric, no curvature. ROM normal. No CVA tenderness. Lungs: clear to auscultation bilaterally Heart: regular rate and rhythm, S1, S2 normal, no murmur, click, rub or gallop Abdomen: soft, non-tender; bowel sounds normal; no masses,  no organomegaly Extremities: extremities normal, atraumatic, no cyanosis or edema Pulses: 2+ and symmetric Skin: Skin color, texture, turgor normal. No rashes or lesions Lymph nodes: Cervical, supraclavicular, and axillary  nodes normal. Neurologic: Grossly normal Psych: Very pleasant, interactive, playful     08/19/2022    9:11 AM 08/19/2021    2:39 PM 06/06/2020   11:58 AM  Depression screen PHQ 2/9  Decreased Interest 0 0 0  Down, Depressed, Hopeless 0 0 0  PHQ - 2 Score 0 0 0  Altered sleeping 0 2 0  Tired, decreased energy 0 2 0  Change in appetite 0 0 0  Feeling bad or failure about yourself  0 0 0  Trouble concentrating 0 0 0  Moving slowly or fidgety/restless 0 0 0  Suicidal thoughts 0 0 0  PHQ-9 Score 0 4 0  Difficult doing work/chores Not difficult at all Not difficult at all       08/19/2022    9:12 AM 08/19/2021    2:39 PM  GAD 7 : Generalized Anxiety Score  Nervous, Anxious, on Edge 0 0  Control/stop worrying 0 0  Worry too much - different things 0 0  Trouble relaxing 0 0  Restless 0 0  Easily annoyed or irritable 0 0  Afraid - awful might happen 0 0  Total GAD 7 Score 0 0  Anxiety Difficulty Not difficult at all Not difficult at all    A/P:  Annual physical exam  Essential hypertension - Plan: CMP14+EGFR, carvedilol (COREG) 25 MG tablet, hydrochlorothiazide (HYDRODIURIL) 25 MG tablet  Neck Bradley - Plan: cyclobenzaprine (FLEXERIL) 10 MG tablet, gabapentin (NEURONTIN) 300 MG capsule, DISCONTINUED: gabapentin (NEURONTIN) 300 MG capsule  BMI 33.0-33.9,adult - Plan: TSH, Lipid Panel, CMP14+EGFR, Bayer DCA Hb A1c Waived  Former smoker - Plan: CBC with Differential  Pre-diabetes  I congratulated her on smoking cessation but unfortunately given the  increased intake of sugar she is now developing prediabetes.  I counseled her on reducing sugar intake, increasing physical exercise.  Blood pressure is controlled.  Medications have been renewed.  Medications have been sent to appropriate pharmacies.  Kathy Bradley, Kathy Bradley Kathy Bradley   -------------------------------------------------------------------------------------------------------------------------------------------------------------------------------------      Subjective: CC: Hypertension PCP: Kathy Bradley ZYS:[id number] D Conerly is a 42 y.o. female presenting to clinic today for her annual physical.   Stress  recent smoking cessation  hypertension Kathy Bradley reports that things have been very stressful at home. She recently found her biological father and sister. Her fiance's son moved in and her fiance had a heart attack that required him to be hospitalized in the cardiac ICU. Additionally, Kathy Bradley and her fiance quit smoking approximately 9 days ago. Kathy Bradley works 13 hour days at work. She reports that she has dramatically increased her sugar intake due to cravings from quitting smoking. She is eating candy constantly throughout the day and has also increased her soda intake. She reports that she has been managing her cravings sufficiently with nicotine patches. Does report occasional numbness and tingling in her right foot, however this has been ongoing. No lesions on the foot, no changes to balance. ROS is negative for chest Bradley, shortness of breath, nausea, vomiting, diarrhea, constipation, hematochezia, hematuria, changes to vision. She does not have problems accessing her medications.   ROS: Per HPI  Allergies  Allergen Reactions   Penicillins Hives    No hx of anaphylaxis   Past Medical History:  Diagnosis Date   Abnormal Pap smear of cervix    Hypertension     Current Outpatient Medications:    albuterol (VENTOLIN HFA) 108 (90 Base) MCG/ACT inhaler, Inhale 2 puffs into the lungs every 6 (six) hours as needed for wheezing or shortness of breath., Disp: 8 g, Rfl: 0   fexofenadine (ALLEGRA) 180 MG tablet, Take 180 mg by mouth daily., Disp: , Rfl:    levonorgestrel (LILETTA) 19.5 MCG/DAY IUD IUD, 1 each by Intrauterine route once.,  Disp: , Rfl:    carvedilol (COREG) 25 MG tablet, Take 1 tablet (25 mg total) by mouth 2 (two) times daily with a meal., Disp: 180 tablet, Rfl: 3   cyclobenzaprine (FLEXERIL) 10 MG tablet, 1 tablet every 8 hours as needed for back Bradley/ spasms, Disp: 270 tablet, Rfl: 3   gabapentin (NEURONTIN) 300 MG capsule, Take 1 capsule (300 mg total) by mouth 4 (four) times daily., Disp: 360 capsule, Rfl: 3   hydrochlorothiazide (HYDRODIURIL) 25 MG tablet, Take 1 tablet (25 mg total) by mouth daily., Disp: 90 tablet, Rfl: 3   predniSONE (STERAPRED UNI-PAK 21 TAB) 10 MG (21) TBPK tablet, As directed x 6 days (Patient not taking: Reported on 08/19/2022), Disp: 21 tablet, Rfl: 0 Social History   Socioeconomic History   Marital status: Divorced    Spouse name: Not on file   Number of children: 1   Years of education: Not on file   Highest education level: Not on file  Occupational History   Occupation: unemployed  Tobacco Use   Smoking status: Every Day    Packs/day: 1.00    Years: 15.00    Total pack years: 15.00    Types: Cigarettes    Last attempt to quit: 11/18/2013    Years since quitting: 8.7   Smokeless tobacco: Never  Vaping Use   Vaping Use: Former  Substance and Sexual Activity   Alcohol use: Yes  Comment: occ.   Drug use: No   Sexual activity: Yes    Birth control/protection: I.U.D.  Other Topics Concern   Not on file  Social History Narrative   Not on file   Social Determinants of Health   Financial Resource Strain: Not on file  Food Insecurity: Not on file  Transportation Needs: Not on file  Physical Activity: Not on file  Stress: Not on file  Social Connections: Not on file  Intimate Partner Violence: Not on file   Family History  Problem Relation Age of Onset   Hypertension Mother    Hypertension Father    Bipolar disorder Brother    Drug abuse Brother    Hypertension Maternal Grandmother    Hypertension Maternal Grandfather    Cancer Paternal Aunt         cervical   Cancer Paternal Grandmother        ovarian    Objective: Office vital signs reviewed. BP 120/84   Pulse 94   Temp 98.6 F (37 C)   Ht 5\' 4"  (1.626 m)   Wt 169 lb (76.7 kg)   SpO2 97%   BMI 29.01 kg/m   Physical Exam General: Awake, alert, well nourished, No acute distress. Sucking on candies.  HEENT: Normal    Neck: No masses palpated. No lymphadenopathy. Thyroid feels normal.     Ears: Tympanic membranes intact, normal light reflex, no erythema, no bulging    Eyes: PERRLA, extraocular membranes intact, sclera white    Nose: nasal turbinates moist, no nasal discharge.     Throat: moist mucus membranes, no erythema, no tonsillar exudate.  Airway is patent Cardio: regular rate and rhythm, S1S2 heard, no murmurs appreciated Pulm: clear to auscultation bilaterally, no wheezes, rhonchi or rales; normal work of breathing on room air GI: soft, non-tender, non-distended, bowel sounds present x4, no hepatomegaly, no splenomegaly, no masses Extremities: warm, well perfused, No edema, cyanosis or clubbing; +1 pulses bilaterally Skin: dry; intact; no rashes or lesions  Assessment/ Plan: 42 y.o. female   Discussed with patient that if she notices her cravings increase despite nicotine patches, we can send in a prescription for varenicline through [facility]. Patient expressed understanding and will revisit health insurance.  Counseled patient at length regarding her increased sugar intake. Discussed with the patient that a diet high in sugar can lead to increased risk for diabetes. Patient reports an understanding. Will check labs today, including A1c, thyroid levels, CMP, CBC, and lipid panel.    Orders Placed This Encounter  Procedures   CBC with Differential   TSH   Lipid Panel   CMP14+EGFR   Bayer DCA Hb A1c Waived   Meds ordered this encounter  Medications   carvedilol (COREG) 25 MG tablet    Sig: Take 1 tablet (25 mg total) by mouth 2 (two) times daily with a  meal.    Dispense:  180 tablet    Refill:  3    [email or web address]   cyclobenzaprine (FLEXERIL) 10 MG tablet    Sig: 1 tablet every 8 hours as needed for back Bradley/ spasms    Dispense:  270 tablet    Refill:  3    [email or web address]   gabapentin (NEURONTIN) 300 MG capsule    Sig: Take 1 capsule (300 mg total) by mouth 4 (four) times daily.    Dispense:  360 capsule    Refill:  3   hydrochlorothiazide (HYDRODIURIL) 25 MG tablet    Sig: Take 1 tablet (25  mg total) by mouth daily.    Dispense:  90 tablet    Refill:  3    [email or web address]   Stephani Police, MS3

## 2022-08-29 ENCOUNTER — Ambulatory Visit (INDEPENDENT_AMBULATORY_CARE_PROVIDER_SITE_OTHER): Payer: Self-pay

## 2022-08-29 DIAGNOSIS — Z111 Encounter for screening for respiratory tuberculosis: Secondary | ICD-10-CM

## 2022-08-29 NOTE — Progress Notes (Signed)
TB skin test administered to left forearm.  Patient tolerated well.  Appointment scheduled on Monday, 09/01/22, to have TB test read.

## 2022-09-01 ENCOUNTER — Ambulatory Visit: Payer: Self-pay | Admitting: *Deleted

## 2022-09-01 LAB — TB SKIN TEST
Induration: 0 mm
TB Skin Test: NEGATIVE

## 2023-08-21 ENCOUNTER — Ambulatory Visit (INDEPENDENT_AMBULATORY_CARE_PROVIDER_SITE_OTHER): Payer: Self-pay | Admitting: Family Medicine

## 2023-08-21 ENCOUNTER — Encounter: Payer: Self-pay | Admitting: Family Medicine

## 2023-08-21 VITALS — BP 121/74 | HR 83 | Temp 98.5°F | Ht 64.0 in | Wt 178.0 lb

## 2023-08-21 DIAGNOSIS — I10 Essential (primary) hypertension: Secondary | ICD-10-CM

## 2023-08-21 DIAGNOSIS — M542 Cervicalgia: Secondary | ICD-10-CM

## 2023-08-21 DIAGNOSIS — Z87891 Personal history of nicotine dependence: Secondary | ICD-10-CM

## 2023-08-21 DIAGNOSIS — M2550 Pain in unspecified joint: Secondary | ICD-10-CM

## 2023-08-21 DIAGNOSIS — E782 Mixed hyperlipidemia: Secondary | ICD-10-CM | POA: Insufficient documentation

## 2023-08-21 DIAGNOSIS — Z566 Other physical and mental strain related to work: Secondary | ICD-10-CM

## 2023-08-21 DIAGNOSIS — R7303 Prediabetes: Secondary | ICD-10-CM

## 2023-08-21 LAB — BAYER DCA HB A1C WAIVED: HB A1C (BAYER DCA - WAIVED): 6.3 % — ABNORMAL HIGH (ref 4.8–5.6)

## 2023-08-21 LAB — LIPID PANEL

## 2023-08-21 MED ORDER — HYDROCHLOROTHIAZIDE 25 MG PO TABS: 25.0000 mg | ORAL_TABLET | Freq: Every day | ORAL | 3 refills | Status: DC

## 2023-08-21 MED ORDER — DICLOFENAC SODIUM 75 MG PO TBEC: 75.0000 mg | DELAYED_RELEASE_TABLET | Freq: Two times a day (BID) | ORAL | 0 refills | Status: DC | PRN

## 2023-08-21 MED ORDER — CARVEDILOL 25 MG PO TABS: 25.0000 mg | ORAL_TABLET | Freq: Two times a day (BID) | ORAL | 3 refills | Status: DC

## 2023-08-21 MED ORDER — GABAPENTIN 300 MG PO CAPS: 300.0000 mg | ORAL_CAPSULE | Freq: Four times a day (QID) | ORAL | 3 refills | Status: DC

## 2023-08-21 MED ORDER — CYCLOBENZAPRINE HCL 10 MG PO TABS: ORAL_TABLET | ORAL | 3 refills | Status: DC

## 2023-08-21 NOTE — Progress Notes (Signed)
Subjective: CC: Hypertension  PCP: Raliegh Ip, DO [id number] D Paar is a 43 y.o. female presenting to clinic today for:  1.  Hypertension Compliant with medications.  Continues to have some lower extremity swelling at the end of a 12-hour day.  She is on her feet all day long.  She has been having more stress at work due to long hours with multiple days in a row working.  Last month she worked over 30 days in a row and this month she is already into the 20s.  Her sleep has not been that great as a result  2.  Chronic neck pain, arthritis She reports neck pain is chronic and stable.  Needs refills.  She is having some increased arthritic issues along the left thumb joint and along the lateral ankle.  She is only able to wear crocs at work because nothing else really will fit her at the end of the day due to edema of the legs.   ROS: Per HPI  Allergies  Allergen Reactions   Penicillins Hives    No hx of anaphylaxis   Past Medical History:  Diagnosis Date   Abnormal Pap smear of cervix    Hypertension     Current Outpatient Medications:    albuterol (VENTOLIN HFA) 108 (90 Base) MCG/ACT inhaler, Inhale 2 puffs into the lungs every 6 (six) hours as needed for wheezing or shortness of breath., Disp: 8 g, Rfl: 0   carvedilol (COREG) 25 MG tablet, Take 1 tablet (25 mg total) by mouth 2 (two) times daily with a meal., Disp: 180 tablet, Rfl: 3   cyclobenzaprine (FLEXERIL) 10 MG tablet, 1 tablet every 8 hours as needed for back pain/ spasms, Disp: 270 tablet, Rfl: 3   fexofenadine (ALLEGRA) 180 MG tablet, Take 180 mg by mouth daily., Disp: , Rfl:    gabapentin (NEURONTIN) 300 MG capsule, Take 1 capsule (300 mg total) by mouth 4 (four) times daily., Disp: 360 capsule, Rfl: 3   hydrochlorothiazide (HYDRODIURIL) 25 MG tablet, Take 1 tablet (25 mg total) by mouth daily., Disp: 90 tablet, Rfl: 3   levonorgestrel (LILETTA) 19.5 MCG/DAY IUD IUD, 1 each by Intrauterine route once., Disp:  , Rfl:    predniSONE (STERAPRED UNI-PAK 21 TAB) 10 MG (21) TBPK tablet, As directed x 6 days, Disp: 21 tablet, Rfl: 0 Social History   Socioeconomic History   Marital status: Divorced    Spouse name: Not on file   Number of children: 1   Years of education: Not on file   Highest education level: Not on file  Occupational History   Occupation: unemployed  Tobacco Use   Smoking status: Every Day    Current packs/day: 0.00    Average packs/day: 1 pack/day for 15.0 years (15.0 ttl pk-yrs)    Types: Cigarettes    Start date: 11/19/1998    Last attempt to quit: 11/18/2013    Years since quitting: 9.7   Smokeless tobacco: Never  Vaping Use   Vaping status: Former  Substance and Sexual Activity   Alcohol use: Yes    Comment: occ.   Drug use: No   Sexual activity: Yes    Birth control/protection: I.U.D.  Other Topics Concern   Not on file  Social History Narrative   Not on file   Social Drivers of Health   Financial Resource Strain: Low Risk  (08/21/2023)   Overall Financial Resource Strain (CARDIA)    Difficulty of Paying Living Expenses: Not hard  at all  Food Insecurity: No Food Insecurity (08/21/2023)   Hunger Vital Sign    Worried About Running Out of Food in the Last Year: Never true    Ran Out of Food in the Last Year: Never true  Transportation Needs: No Transportation Needs (08/21/2023)   PRAPARE - Administrator, Civil Service (Medical): No    Lack of Transportation (Non-Medical): No  Physical Activity: Inactive (08/21/2023)   Exercise Vital Sign    Days of Exercise per Week: 0 days    Minutes of Exercise per Session: 0 min  Stress: No Stress Concern Present (08/21/2023)   [employer] of Occupational Health - Occupational Stress Questionnaire    Feeling of Stress : Not at all  Social Connections: Socially Integrated (08/21/2023)   Social Connection and Isolation Panel [NHANES]    Frequency of Communication with Friends and Family: Three times a week     Frequency of Social Gatherings with Friends and Family: Three times a week    Attends Religious Services: More than 4 times per year    Active Member of Clubs or Organizations: Yes    Attends Banker Meetings: More than 4 times per year    Marital Status: Married  Catering manager Violence: Not At Risk (08/21/2023)   Humiliation, Afraid, Rape, and Kick questionnaire    Fear of Current or Ex-Partner: No    Emotionally Abused: No    Physically Abused: No    Sexually Abused: No   Family History  Problem Relation Age of Onset   Hypertension Mother    Hypertension Father    Bipolar disorder Brother    Drug abuse Brother    Hypertension Maternal Grandmother    Hypertension Maternal Grandfather    Cancer Paternal Aunt        cervical   Cancer Paternal Grandmother        ovarian    Objective: Office vital signs reviewed. BP 121/74   Pulse 83   Temp 98.5 F (36.9 C)   Ht 5\' 4"  (1.626 m)   Wt 178 lb (80.7 kg)   SpO2 98%   BMI 30.55 kg/m   Physical Examination:  General: Awake, alert, well nourished, No acute distress HEENT: sclera white, MMM Cardio: regular rate and rhythm, S1S2 heard, no murmurs appreciated Pulm: clear to auscultation bilaterally, no wheezes, rhonchi or rales; normal work of breathing on room air      08/21/2023   10:27 AM 08/21/2023   10:25 AM 08/19/2022    9:11 AM  Depression screen PHQ 2/9  Decreased Interest 0 0 0  Down, Depressed, Hopeless 0 0 0  PHQ - 2 Score 0 0 0  Altered sleeping 0 0 0  Tired, decreased energy 0 0 0  Change in appetite 0 0 0  Feeling bad or failure about yourself  0 0 0  Trouble concentrating 0 0 0  Moving slowly or fidgety/restless 0 0 0  Suicidal thoughts 0 0 0  PHQ-9 Score 0 0 0  Difficult doing work/chores Not difficult at all Not difficult at all Not difficult at all      08/21/2023   10:28 AM 08/21/2023   10:25 AM 08/19/2022    9:12 AM 08/19/2021    2:39 PM  GAD 7 : Generalized Anxiety Score  Nervous,  Anxious, on Edge 0 0 0 0  Control/stop worrying 0 0 0 0  Worry too much - different things 0 0 0 0  Trouble relaxing  0 0 0 0  Restless 0 0 0 0  Easily annoyed or irritable 0 0 0 0  Afraid - awful might happen 0 0 0 0  Total GAD 7 Score 0 0 0 0  Anxiety Difficulty Not difficult at all Not difficult at all Not difficult at all Not difficult at all     Assessment/ Plan: 43 y.o. female   Mixed hyperlipidemia - Plan: CMP14+EGFR, Lipid Panel  Essential hypertension - Plan: carvedilol (COREG) 25 MG tablet, hydrochlorothiazide (HYDRODIURIL) 25 MG tablet  Pre-diabetes - Plan: Bayer DCA Hb A1c Waived  Former smoker - Plan: CBC with Differential  Polyarthralgia - Plan: diclofenac (VOLTAREN) 75 MG EC tablet  Neck pain - Plan: gabapentin (NEURONTIN) 300 MG capsule, cyclobenzaprine (FLEXERIL) 10 MG tablet  Stress at work  Fasting labs collected.  Blood pressure well-controlled.  Medications have been renewed.  Sadly despite having gotten rid of candy, her A1c continues to rise and she is nearing diabetic level.  I counseled her on eliminating soda intake.  Will plan to see each other back again in 6 months for recheck  Voltaren given for multiple arthritic issues that are refractory to ibuprofen.  Gabapentin and Flexeril renewed for chronic degenerative neck issues.  Offered cervical cancer screening etc. but she is uninsured and declined this for now.  I did give her information on screening mammogram scholarship funded through [facility]  Not currently treated with any medications for stress.  Her PHQ and GAD were negative   Raliegh Ip, DO [facility] [phone]

## 2023-08-22 ENCOUNTER — Encounter: Payer: Self-pay | Admitting: Family Medicine

## 2023-08-22 LAB — CMP14+EGFR
ALT: 16 [IU]/L (ref 0–32)
AST: 9 [IU]/L (ref 0–40)
Albumin: 4.4 g/dL (ref 3.9–4.9)
Alkaline Phosphatase: 38 [IU]/L — ABNORMAL LOW (ref 44–121)
BUN/Creatinine Ratio: 16 (ref 9–23)
BUN: 10 mg/dL (ref 6–24)
Bilirubin Total: 0.4 mg/dL (ref 0.0–1.2)
CO2: 25 mmol/L (ref 20–29)
Calcium: 9.4 mg/dL (ref 8.7–10.2)
Chloride: 100 mmol/L (ref 96–106)
Creatinine, Ser: 0.64 mg/dL (ref 0.57–1.00)
Globulin, Total: 2.1 g/dL (ref 1.5–4.5)
Glucose: 114 mg/dL — ABNORMAL HIGH (ref 70–99)
Potassium: 3.4 mmol/L — ABNORMAL LOW (ref 3.5–5.2)
Sodium: 139 mmol/L (ref 134–144)
Total Protein: 6.5 g/dL (ref 6.0–8.5)
eGFR: 114 mL/min/{1.73_m2} (ref >59)

## 2023-08-22 LAB — CBC WITH DIFFERENTIAL/PLATELET
Basophils Absolute: 0 10*3/uL (ref 0.0–0.2)
Basos: 1 %
EOS (ABSOLUTE): 0.1 10*3/uL (ref 0.0–0.4)
Eos: 1 %
Hematocrit: 38.3 % (ref 34.0–46.6)
Hemoglobin: 12.6 g/dL (ref 11.1–15.9)
Immature Grans (Abs): 0 10*3/uL (ref 0.0–0.1)
Immature Granulocytes: 0 %
Lymphocytes Absolute: 2.4 10*3/uL (ref 0.7–3.1)
Lymphs: 39 %
MCH: 29.7 pg (ref 26.6–33.0)
MCHC: 32.9 g/dL (ref 31.5–35.7)
MCV: 90 fL (ref 79–97)
Monocytes Absolute: 0.4 10*3/uL (ref 0.1–0.9)
Monocytes: 6 %
Neutrophils Absolute: 3.2 10*3/uL (ref 1.4–7.0)
Neutrophils: 53 %
Platelets: 193 10*3/uL (ref 150–450)
RBC: 4.24 x10E6/uL (ref 3.77–5.28)
RDW: 12.5 % (ref 11.7–15.4)
WBC: 6.1 10*3/uL (ref 3.4–10.8)

## 2023-08-22 LAB — LIPID PANEL
Cholesterol, Total: 187 mg/dL (ref 100–199)
HDL: 71 mg/dL (ref >39)
LDL CALC COMMENT:: 2.6 ratio (ref 0.0–4.4)
LDL Chol Calc (NIH): 103 mg/dL — ABNORMAL HIGH (ref 0–99)
Triglycerides: 69 mg/dL (ref 0–149)
VLDL Cholesterol Cal: 13 mg/dL (ref 5–40)

## 2023-09-18 ENCOUNTER — Other Ambulatory Visit: Payer: Self-pay | Admitting: Family Medicine

## 2023-09-18 DIAGNOSIS — M2550 Pain in unspecified joint: Secondary | ICD-10-CM

## 2023-11-14 ENCOUNTER — Encounter: Payer: Self-pay | Admitting: Family Medicine

## 2023-11-16 NOTE — Telephone Encounter (Signed)
 Please give her an appt on my DOD tomorrow.

## 2023-11-17 ENCOUNTER — Encounter: Payer: Self-pay | Admitting: Family Medicine

## 2023-11-17 ENCOUNTER — Ambulatory Visit (INDEPENDENT_AMBULATORY_CARE_PROVIDER_SITE_OTHER): Payer: Self-pay

## 2023-11-17 VITALS — BP 122/81 | HR 80 | Temp 98.8°F | Ht 64.0 in | Wt 181.4 lb

## 2023-11-17 DIAGNOSIS — L600 Ingrowing nail: Secondary | ICD-10-CM

## 2023-11-17 MED ORDER — TRAMADOL HCL 50 MG PO TABS: 50.0000 mg | ORAL_TABLET | Freq: Two times a day (BID) | ORAL | 0 refills | Status: AC | PRN

## 2023-11-17 MED ORDER — KETOROLAC TROMETHAMINE 30 MG/ML IJ SOLN
30.0000 mg | Freq: Once | INTRAMUSCULAR | Status: AC
  Administered 2023-11-17: 30 mg via INTRAMUSCULAR

## 2023-11-17 MED ORDER — CEFTRIAXONE SODIUM 1 G IJ SOLR
1.0000 g | Freq: Once | INTRAMUSCULAR | Status: AC
  Administered 2023-11-17: 1 g via INTRAMUSCULAR

## 2023-11-17 MED ORDER — CEPHALEXIN 500 MG PO CAPS: 500.0000 mg | ORAL_CAPSULE | Freq: Four times a day (QID) | ORAL | 0 refills | Status: AC

## 2023-11-17 NOTE — Progress Notes (Signed)
 Subjective: CC: Ingrown toenail PCP: Eliodoro Guerin, DO [id number] Kathy Bradley is a 43 y.o. female presenting to clinic today for:  1.  Ingrown toenail Patient reports that she developed an ingrown toenail in her right great toe about 3 weeks ago.  She tried to get out herself but now its become swollen, tender.  She reports no fevers.  She has had some localized swelling of the toe.  Has been utilizing peroxide daily to clean.  Voltaren  not helping for pain relief   ROS: Per HPI  Allergies  Allergen Reactions   Penicillins Hives    No hx of anaphylaxis   Past Medical History:  Diagnosis Date   Abnormal Pap smear of cervix    Hypertension     Current Outpatient Medications:    albuterol  (VENTOLIN  HFA) 108 (90 Base) MCG/ACT inhaler, Inhale 2 puffs into the lungs every 6 (six) hours as needed for wheezing or shortness of breath., Disp: 8 g, Rfl: 0   carvedilol  (COREG ) 25 MG tablet, Take 1 tablet (25 mg total) by mouth 2 (two) times daily with a meal., Disp: 180 tablet, Rfl: 3   cyclobenzaprine  (FLEXERIL ) 10 MG tablet, 1 tablet every 8 hours as needed for back pain/ spasms, Disp: 270 tablet, Rfl: 3   diclofenac  (VOLTAREN ) 75 MG EC tablet, TAKE 1 TABLET TWICE DAILY AS NEEDED FOR MODERATE PAIN (PAIN SCORE 4-6).8, Disp: 60 tablet, Rfl: 5   fexofenadine (ALLEGRA) 180 MG tablet, Take 180 mg by mouth daily., Disp: , Rfl:    gabapentin  (NEURONTIN ) 300 MG capsule, Take 1 capsule (300 mg total) by mouth 4 (four) times daily., Disp: 360 capsule, Rfl: 3   hydrochlorothiazide  (HYDRODIURIL ) 25 MG tablet, Take 1 tablet (25 mg total) by mouth daily., Disp: 90 tablet, Rfl: 3   levonorgestrel  (LILETTA ) 19.5 MCG/DAY IUD IUD, 1 each by Intrauterine route once., Disp: , Rfl:  Social History   Socioeconomic History   Marital status: Divorced    Spouse name: Not on file   Number of children: 1   Years of education: Not on file   Highest education level: Not on file  Occupational History    Occupation: unemployed  Tobacco Use   Smoking status: Every Day    Current packs/day: 0.00    Average packs/day: 1 pack/day for 15.0 years (15.0 ttl pk-yrs)    Types: Cigarettes    Start date: 11/19/1998    Last attempt to quit: 11/18/2013    Years since quitting: 10.0   Smokeless tobacco: Never  Vaping Use   Vaping status: Former  Substance and Sexual Activity   Alcohol use: Yes    Comment: occ.   Drug use: No   Sexual activity: Yes    Birth control/protection: I.U.Kathy.  Other Topics Concern   Not on file  Social History Narrative   Not on file   Social Drivers of Health   Financial Resource Strain: Low Risk  (08/21/2023)   Overall Financial Resource Strain (CARDIA)    Difficulty of Paying Living Expenses: Not hard at all  Food Insecurity: No Food Insecurity (08/21/2023)   Hunger Vital Sign    Worried About Running Out of Food in the Last Year: Never true    Ran Out of Food in the Last Year: Never true  Transportation Needs: No Transportation Needs (08/21/2023)   PRAPARE - Administrator, Civil Service (Medical): No    Lack of Transportation (Non-Medical): No  Physical Activity: Inactive (08/21/2023)   Exercise  Vital Sign    Days of Exercise per Week: 0 days    Minutes of Exercise per Session: 0 min  Stress: No Stress Concern Present (08/21/2023)   [employer] of Occupational Health - Occupational Stress Questionnaire    Feeling of Stress : Not at all  Social Connections: Socially Integrated (08/21/2023)   Social Connection and Isolation Panel [NHANES]    Frequency of Communication with Friends and Family: Three times a week    Frequency of Social Gatherings with Friends and Family: Three times a week    Attends Religious Services: More than 4 times per year    Active Member of Clubs or Organizations: Yes    Attends Banker Meetings: More than 4 times per year    Marital Status: Married  Catering manager Violence: Not At Risk (08/21/2023)    Humiliation, Afraid, Rape, and Kick questionnaire    Fear of Current or Ex-Partner: No    Emotionally Abused: No    Physically Abused: No    Sexually Abused: No   Family History  Problem Relation Age of Onset   Hypertension Mother    Hypertension Father    Bipolar disorder Brother    Drug abuse Brother    Hypertension Maternal Grandmother    Hypertension Maternal Grandfather    Cancer Paternal Aunt        cervical   Cancer Paternal Grandmother        ovarian    Objective: Office vital signs reviewed. BP 122/81   Pulse 80   Temp 98.8 F (37.1 C)   Ht 5\' 4"  (1.626 m)   Wt 181 lb 6.4 oz (82.3 kg)   SpO2 100%   BMI 31.14 kg/m   Physical Examination:  General: Awake, alert, well nourished, No acute distress MSK: Medial aspect of the right great toe with inflammation.  No active purulence but there is erythema, warmth and tenderness.  No evidence of joint involvement.  Assessment/ Plan: 43 y.o. female   Ingrown toenail of left foot with infection - Plan: cefTRIAXone (ROCEPHIN) injection 1 g, cephALEXin (KEFLEX) 500 MG capsule, ketorolac  (TORADOL ) 30 MG/ML injection 30 mg, traMADol (ULTRAM) 50 MG tablet  Going to treat her for infection with Keflex 4 times daily.  She may start 4 times daily dosing tomorrow.  She was given a dose of Rocephin here in office along with a Toradol  injection.  Tramadol also given for sparing use for severe pain not controlled by diclofenac .  We discussed that if symptoms or not improving over the next week and/or they get worse then I would like her to be seen with podiatry for excision.  In the meantime Epsom salt soaks with gentle pushing back of the tissue advised to hopefully release the nail   Eliodoro Guerin, DO [facility] [phone]

## 2023-11-17 NOTE — Patient Instructions (Signed)
Soak your foot 3 times daily, for at least 15 minutes each time, in warm water mixed with Epsom salt.  Peel the skin near the ingrown nail back gently with each soak.  This will help the nail grow out.  You may take Ibuprofen for discomfort.  If you develop fevers, chills, worsening pain, worsening redness or swelling, please seek medical attention.  Remember to cut your nails straight across.  Avoid cutting them at an angle, as this will increase your risk of developing an ingrown nail.  If there is no improvement in your discomfort in the next 2 weeks, please call to schedule a toenail removal.

## 2024-02-15 ENCOUNTER — Other Ambulatory Visit: Payer: Self-pay | Admitting: Medical Genetics

## 2024-02-19 ENCOUNTER — Encounter: Payer: Self-pay | Admitting: Family Medicine

## 2024-02-19 ENCOUNTER — Ambulatory Visit: Payer: Self-pay | Admitting: Family Medicine

## 2024-02-19 VITALS — BP 119/81 | HR 81 | Temp 97.7°F | Ht 64.0 in | Wt 181.2 lb

## 2024-02-19 DIAGNOSIS — M542 Cervicalgia: Secondary | ICD-10-CM

## 2024-02-19 DIAGNOSIS — I10 Essential (primary) hypertension: Secondary | ICD-10-CM

## 2024-02-19 DIAGNOSIS — R7303 Prediabetes: Secondary | ICD-10-CM

## 2024-02-19 DIAGNOSIS — L600 Ingrowing nail: Secondary | ICD-10-CM

## 2024-02-19 DIAGNOSIS — J9801 Acute bronchospasm: Secondary | ICD-10-CM

## 2024-02-19 LAB — BAYER DCA HB A1C WAIVED: HB A1C (BAYER DCA - WAIVED): 5.8 % — ABNORMAL HIGH (ref 4.8–5.6)

## 2024-02-19 MED ORDER — FLUCONAZOLE 150 MG PO TABS: 150.0000 mg | ORAL_TABLET | Freq: Once | ORAL | 0 refills | Status: AC

## 2024-02-19 MED ORDER — ALBUTEROL SULFATE HFA 108 (90 BASE) MCG/ACT IN AERS
2.0000 | INHALATION_SPRAY | Freq: Four times a day (QID) | RESPIRATORY_TRACT | 0 refills | Status: AC | PRN
Start: 2024-02-19 — End: ?

## 2024-02-19 MED ORDER — CEPHALEXIN 500 MG PO CAPS: 500.0000 mg | ORAL_CAPSULE | Freq: Four times a day (QID) | ORAL | 0 refills | Status: AC

## 2024-02-19 MED ORDER — HYDROCHLOROTHIAZIDE 25 MG PO TABS: 25.0000 mg | ORAL_TABLET | Freq: Every day | ORAL | 3 refills | Status: AC

## 2024-02-19 MED ORDER — CYCLOBENZAPRINE HCL 10 MG PO TABS: ORAL_TABLET | ORAL | 3 refills | Status: AC

## 2024-02-19 MED ORDER — GABAPENTIN 300 MG PO CAPS: 300.0000 mg | ORAL_CAPSULE | Freq: Four times a day (QID) | ORAL | 3 refills | Status: AC

## 2024-02-19 MED ORDER — CARVEDILOL 25 MG PO TABS: 25.0000 mg | ORAL_TABLET | Freq: Two times a day (BID) | ORAL | 3 refills | Status: AC

## 2024-02-19 NOTE — Progress Notes (Signed)
 Subjective: CC: Ingrown toenail PCP: Jolinda Kathy HERO, DO Kathy Bradley is a 43 y.o. female presenting to clinic today for:  1.  Ingrown toenail Patient reports that 2 days ago she started having some oozing from the left great toe.  This is similar to when she had an ingrown toenail that was infected previously.  No reports of excruciating pain, difficulty with joint movement or fevers.  2.  Prediabetes She reports that she has continued to eat lots of sugar but is not eating as much as she used to.  She is working 12-hour days most days at [employer] and today is day 19 in a row without a break   ROS: Per HPI  Allergies  Allergen Reactions   Penicillins Hives    No hx of anaphylaxis   Past Medical History:  Diagnosis Date   Abnormal Pap smear of cervix    Hypertension     Current Outpatient Medications:    albuterol  (VENTOLIN  HFA) 108 (90 Base) MCG/ACT inhaler, Inhale 2 puffs into the lungs every 6 (six) hours as needed for wheezing or shortness of breath., Disp: 8 g, Rfl: 0   carvedilol  (COREG ) 25 MG tablet, Take 1 tablet (25 mg total) by mouth 2 (two) times daily with a meal., Disp: 180 tablet, Rfl: 3   cyclobenzaprine  (FLEXERIL ) 10 MG tablet, 1 tablet every 8 hours as needed for back pain/ spasms, Disp: 270 tablet, Rfl: 3   fexofenadine (ALLEGRA) 180 MG tablet, Take 180 mg by mouth daily., Disp: , Rfl:    gabapentin  (NEURONTIN ) 300 MG capsule, Take 1 capsule (300 mg total) by mouth 4 (four) times daily., Disp: 360 capsule, Rfl: 3   hydrochlorothiazide  (HYDRODIURIL ) 25 MG tablet, Take 1 tablet (25 mg total) by mouth daily., Disp: 90 tablet, Rfl: 3   levonorgestrel  (LILETTA ) 19.5 MCG/DAY IUD IUD, 1 each by Intrauterine route once., Disp: , Rfl:    diclofenac  (VOLTAREN ) 75 MG EC tablet, TAKE 1 TABLET TWICE DAILY AS NEEDED FOR MODERATE PAIN (PAIN SCORE 4-6).8 (Patient not taking: Reported on 02/19/2024), Disp: 60 tablet, Rfl: 5 Social History   Socioeconomic History    Marital status: Divorced    Spouse name: Not on file   Number of children: 1   Years of education: Not on file   Highest education level: Not on file  Occupational History   Occupation: unemployed  Tobacco Use   Smoking status: Every Day    Current packs/day: 0.00    Average packs/day: 1 pack/day for 15.0 years (15.0 ttl pk-yrs)    Types: Cigarettes    Start date: 11/19/1998    Last attempt to quit: 11/18/2013    Years since quitting: 10.2   Smokeless tobacco: Never  Vaping Use   Vaping status: Former  Substance and Sexual Activity   Alcohol use: Yes    Comment: occ.   Drug use: No   Sexual activity: Yes    Birth control/protection: I.U.D.  Other Topics Concern   Not on file  Social History Narrative   Not on file   Social Drivers of Health   Financial Resource Strain: Low Risk  (08/21/2023)   Overall Financial Resource Strain (CARDIA)    Difficulty of Paying Living Expenses: Not hard at all  Food Insecurity: No Food Insecurity (08/21/2023)   Hunger Vital Sign    Worried About Running Out of Food in the Last Year: Never true    Ran Out of Food in the Last Year: Never true  Transportation Needs: No Transportation Needs (08/21/2023)   PRAPARE - Administrator, Civil Service (Medical): No    Lack of Transportation (Non-Medical): No  Physical Activity: Inactive (08/21/2023)   Exercise Vital Sign    Days of Exercise per Week: 0 days    Minutes of Exercise per Session: 0 min  Stress: No Stress Concern Present (08/21/2023)   [employer] of Occupational Health - Occupational Stress Questionnaire    Feeling of Stress : Not at all  Social Connections: Socially Integrated (08/21/2023)   Social Connection and Isolation Panel    Frequency of Communication with Friends and Family: Three times a week    Frequency of Social Gatherings with Friends and Family: Three times a week    Attends Religious Services: More than 4 times per year    Active Member of Clubs or  Organizations: Yes    Attends Banker Meetings: More than 4 times per year    Marital Status: Married  Catering manager Violence: Not At Risk (08/21/2023)   Humiliation, Afraid, Rape, and Kick questionnaire    Fear of Current or Ex-Partner: No    Emotionally Abused: No    Physically Abused: No    Sexually Abused: No   Family History  Problem Relation Age of Onset   Hypertension Mother    Hypertension Father    Bipolar disorder Brother    Drug abuse Brother    Hypertension Maternal Grandmother    Hypertension Maternal Grandfather    Cancer Paternal Aunt        cervical   Cancer Paternal Grandmother        ovarian    Objective: Office vital signs reviewed. BP 119/81   Pulse 81   Temp 97.7 F (36.5 C)   Ht 5' 4 (1.626 m)   Wt 181 lb 4 oz (82.2 kg)   SpO2 97%   BMI 31.11 kg/m   Physical Examination:  General: Awake, alert, well nourished, No acute distress HEENT: Sclera white.  Moist mucous membranes Cardio: regular rate and rhythm, S1S2 heard, no murmurs appreciated Pulm: clear to auscultation bilaterally, no wheezes, rhonchi or rales; normal work of breathing on room air MSK: Left great toe with purulent material noted medially.  She has ingrowing but no significant erythema.  No joint involvement  Assessment/ Plan: 43 y.o. female   Ingrowing toenail with infection - Plan: cephALEXin  (KEFLEX ) 500 MG capsule  Pre-diabetes - Plan: Bayer DCA Hb A1c Waived  Bronchospasm - Plan: albuterol  (VENTOLIN  HFA) 108 (90 Base) MCG/ACT inhaler  Essential hypertension - Plan: carvedilol  (COREG ) 25 MG tablet, hydrochlorothiazide  (HYDRODIURIL ) 25 MG tablet  Neck pain - Plan: cyclobenzaprine  (FLEXERIL ) 10 MG tablet, gabapentin  (NEURONTIN ) 300 MG capsule  Keflex  for toenail.  Discussed need for podiatry  A1c demonstrates prediabetes but improved at 5.8 today  Did not discuss chronic issues today but refilled medications x 1 year   Kathy CHRISTELLA Fielding, DO [facility] [phone]

## 2024-04-27 ENCOUNTER — Encounter: Payer: Self-pay | Admitting: Family Medicine

## 2024-04-27 DIAGNOSIS — L600 Ingrowing nail: Secondary | ICD-10-CM

## 2024-04-27 MED ORDER — CEPHALEXIN 500 MG PO CAPS: 500.0000 mg | ORAL_CAPSULE | Freq: Four times a day (QID) | ORAL | 0 refills | Status: AC

## 2024-04-27 MED ORDER — FLUCONAZOLE 150 MG PO TABS: 150.0000 mg | ORAL_TABLET | Freq: Once | ORAL | 0 refills | Status: AC

## 2024-04-27 NOTE — Telephone Encounter (Signed)

## 2024-04-30 ENCOUNTER — Other Ambulatory Visit: Payer: Self-pay | Admitting: Medical Genetics

## 2024-04-30 DIAGNOSIS — Z006 Encounter for examination for normal comparison and control in clinical research program: Secondary | ICD-10-CM

## 2024-05-24 LAB — GENECONNECT MOLECULAR SCREEN: Genetic Analysis Overall Interpretation: NEGATIVE

## 2024-07-19 ENCOUNTER — Encounter: Payer: Self-pay | Admitting: Family Medicine

## 2024-07-20 ENCOUNTER — Encounter: Payer: Self-pay | Admitting: Family Medicine

## 2024-07-20 ENCOUNTER — Ambulatory Visit (INDEPENDENT_AMBULATORY_CARE_PROVIDER_SITE_OTHER): Payer: Self-pay | Admitting: Family Medicine

## 2024-07-20 VITALS — BP 117/82 | HR 82 | Temp 98.2°F | Ht 64.0 in | Wt 180.0 lb

## 2024-07-20 DIAGNOSIS — J069 Acute upper respiratory infection, unspecified: Secondary | ICD-10-CM

## 2024-07-20 LAB — RAPID STREP SCREEN (MED CTR MEBANE ONLY): Strep Gp A Ag, IA W/Reflex: NEGATIVE

## 2024-07-20 LAB — CULTURE, GROUP A STREP

## 2024-07-20 LAB — VERITOR SARS-COV-2 AND FLU A+B
BD Veritor SARS-CoV-2 Ag: NEGATIVE
Influenza A: NEGATIVE
Influenza B: NEGATIVE

## 2024-07-20 MED ORDER — BENZONATATE 100 MG PO CAPS: 100.0000 mg | ORAL_CAPSULE | Freq: Two times a day (BID) | ORAL | 0 refills | Status: AC | PRN

## 2024-07-20 MED ORDER — PREDNISONE 20 MG PO TABS: 40.0000 mg | ORAL_TABLET | Freq: Every day | ORAL | 0 refills | Status: AC

## 2024-07-20 MED ORDER — PROMETHAZINE-DM 6.25-15 MG/5ML PO SYRP: 5.0000 mL | ORAL_SOLUTION | Freq: Four times a day (QID) | ORAL | 0 refills | Status: AC | PRN

## 2024-07-20 NOTE — Progress Notes (Signed)
 "  Subjective: CC: Flulike symptoms PCP: Jolinda Kathy HERO, DO YEP:Kathy Bradley is a 43 y.o. female presenting to clinic today for:  Patient reports that she developed flulike symptoms on Sunday where she became extremely hoarse, had sore throat, headache sinus pressure, cough and some chest tightness.  She denies any hemoptysis, wheezing or shortness of breath.  She reports some nausea and vomiting last evening.  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  She has been utilizing cough drops, NyQuil and TheraFlu but symptoms are not improving.  No known exposures to any infectious processes but she works in a nursing home   ROS: Per HPI  Allergies[1] Past Medical History:  Diagnosis Date   Abnormal Pap smear of cervix    Hypertension    Current Medications[2] Social History   Socioeconomic History   Marital status: Divorced    Spouse name: Not on file   Number of children: 1   Years of education: Not on file   Highest education level: Not on file  Occupational History   Occupation: unemployed  Tobacco Use   Smoking status: Every Day    Current packs/day: 0.00    Average packs/day: 1 pack/day for 15.0 years (15.0 ttl pk-yrs)    Types: Cigarettes    Start date: 11/19/1998    Last attempt to quit: 11/18/2013    Years since quitting: 10.6   Smokeless tobacco: Never  Vaping Use   Vaping status: Former  Substance and Sexual Activity   Alcohol use: Yes    Comment: occ.   Drug use: No   Sexual activity: Yes    Birth control/protection: I.U.D.  Other Topics Concern   Not on file  Social History Narrative   Not on file   Social Drivers of Health   Tobacco Use: High Risk (02/19/2024)   Patient History    Smoking Tobacco Use: Every Day    Smokeless Tobacco Use: Never    Passive Exposure: Not on file  Financial Resource Strain: Low Risk (08/21/2023)   Overall Financial Resource Strain (CARDIA)     Difficulty of Paying Living Expenses: Not hard at all  Food Insecurity: No Food Insecurity (08/21/2023)   Hunger Vital Sign    Worried About Running Out of Food in the Last Year: Never true    Ran Out of Food in the Last Year: Never true  Transportation Needs: No Transportation Needs (08/21/2023)   PRAPARE - Administrator, Civil Service (Medical): No    Lack of Transportation (Non-Medical): No  Physical Activity: Inactive (08/21/2023)   Exercise Vital Sign    Days of Exercise per Week: 0 days    Minutes of Exercise per Session: 0 min  Stress: No Stress Concern Present (08/21/2023)   [employer] of Occupational Health - Occupational Stress Questionnaire    Feeling of Stress : Not at all  Social Connections: Socially Integrated (08/21/2023)   Social Connection and Isolation Panel    Frequency of Communication with Friends and Family: Three times a week    Frequency of Social Gatherings with Friends and Family: Three times a week    Attends Religious Services: More than 4 times per year    Active Member of Clubs or Organizations: Yes    Attends Banker Meetings: More than 4 times per year    Marital Status: Married  Catering Manager Violence: Not At Risk (08/21/2023)   Humiliation,  Afraid, Rape, and Kick questionnaire    Fear of Current or Ex-Partner: No    Emotionally Abused: No    Physically Abused: No    Sexually Abused: No  Depression (PHQ2-9): Low Risk (02/19/2024)   Depression (PHQ2-9)    PHQ-2 Score: 0  Alcohol Screen: Low Risk (08/21/2023)   Alcohol Screen    Last Alcohol Screening Score (AUDIT): 0  Housing: Unknown (08/21/2023)   Housing Stability Vital Sign    Unable to Pay for Housing in the Last Year: No    Number of Times Moved in the Last Year: Not on file    Homeless in the Last Year: No  Utilities: Not At Risk (08/21/2023)   [facility] Utilities    Threatened with loss of utilities: No  Health Literacy: Adequate Health Literacy (08/21/2023)    B1300 Health Literacy    Frequency of need for help with medical instructions: Never   Family History  Problem Relation Age of Onset   Hypertension Mother    Hypertension Father    Bipolar disorder Brother    Drug abuse Brother    Hypertension Maternal Grandmother    Hypertension Maternal Grandfather    Cancer Paternal Aunt        cervical   Cancer Paternal Grandmother        ovarian    Objective: Office vital signs reviewed. BP 117/82   Pulse 82   Temp 98.2 F (36.8 C)   Ht 5' 4 (1.626 m)   Wt 180 lb (81.6 kg)   SpO2 96%   BMI 30.90 kg/m   Physical Examination:  General: Awake, alert, tired, ill-appearing but nontoxic, No acute distress HEENT: Normal    Neck: No masses palpated.  Left anterior cervical lymph node mildly enlarged    Ears: Tympanic membranes intact, normal light reflex, no erythema, no bulging    Eyes: PERRLA, extraocular membranes intact, sclera white    Nose: nasal turbinates moist, clear nasal discharge with erythematous and edematous nasal turbinates    Throat: moist mucus membranes moderate oropharyngeal erythema, no tonsillar exudate.  Airway is patent Cardio: regular rate and rhythm, S1S2 heard, no murmurs appreciated Pulm: clear to auscultation bilaterally, no wheezes, rhonchi or rales; normal work of breathing on room air    Assessment/ Plan: 43 y.o. female   Viral URI with cough - Plan: Veritor SARS-CoV-2 and Flu A+B, Rapid Strep Screen ([facility] ONLY), benzonatate (TESSALON) 100 MG capsule, promethazine-dextromethorphan (PROMETHAZINE-DM) 6.25-15 MG/5ML syrup, predniSONE  (DELTASONE ) 20 MG tablet   Negative COVID, strep, flu.  Will treat with prednisone  burst, cough suppressants.  Supportive care recommended.  Red flag signs and symptoms warranting further evaluation and/or antibiotics discussed.  Work note provided excusing through Monday.  Follow-up as needed   Kathy CHRISTELLA Fielding, DO [facility] [phone]     [1]  Allergies Allergen Reactions   Penicillins Hives    No hx of anaphylaxis  [2]  Current Outpatient Medications:    albuterol  (VENTOLIN  HFA) 108 (90 Base) MCG/ACT inhaler, Inhale 2 puffs into the lungs every 6 (six) hours as needed for wheezing or shortness of breath., Disp: 8 g, Rfl: 0   carvedilol  (COREG ) 25 MG tablet, Take 1 tablet (25 mg total) by mouth 2 (two) times daily with a meal., Disp: 180 tablet, Rfl: 3   cyclobenzaprine  (FLEXERIL ) 10 MG tablet, 1 tablet every 8 hours as needed for back pain/ spasms, Disp: 270 tablet, Rfl: 3   fexofenadine (ALLEGRA) 180 MG  tablet, Take 180 mg by mouth daily., Disp: , Rfl:    gabapentin  (NEURONTIN ) 300 MG capsule, Take 1 capsule (300 mg total) by mouth 4 (four) times daily., Disp: 360 capsule, Rfl: 3   hydrochlorothiazide  (HYDRODIURIL ) 25 MG tablet, Take 1 tablet (25 mg total) by mouth daily., Disp: 90 tablet, Rfl: 3   levonorgestrel  (LILETTA ) 19.5 MCG/DAY IUD IUD, 1 each by Intrauterine route once., Disp: , Rfl:    diclofenac  (VOLTAREN ) 75 MG EC tablet, TAKE 1 TABLET TWICE DAILY AS NEEDED FOR MODERATE PAIN (PAIN SCORE 4-6).8 (Patient not taking: Reported on 07/20/2024), Disp: 60 tablet, Rfl: 5  "

## 2024-07-20 NOTE — Patient Instructions (Signed)

## 2024-08-22 ENCOUNTER — Encounter: Payer: Self-pay | Admitting: Family Medicine

## 2025-02-21 ENCOUNTER — Encounter: Payer: Self-pay | Admitting: Family Medicine
